# Patient Record
Sex: Female | Born: 1990 | Race: White | Hispanic: No | Marital: Single | Smoking: Never smoker
Health system: Southern US, Community
[De-identification: ages and names within clinical notes are randomized; demographics above are authoritative.]

## PROBLEM LIST (undated history)

## (undated) DIAGNOSIS — F429 Obsessive-compulsive disorder, unspecified: Secondary | ICD-10-CM

## (undated) HISTORY — PX: WISDOM TOOTH EXTRACTION: SHX21

## (undated) HISTORY — PX: APPENDECTOMY: SHX54

## (undated) HISTORY — PX: TONSILLECTOMY: SUR1361

---
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
## 2015-11-28 DIAGNOSIS — D225 Melanocytic nevi of trunk: Secondary | ICD-10-CM | POA: Diagnosis not present

## 2015-11-28 DIAGNOSIS — D2262 Melanocytic nevi of left upper limb, including shoulder: Secondary | ICD-10-CM | POA: Diagnosis not present

## 2015-11-28 DIAGNOSIS — D224 Melanocytic nevi of scalp and neck: Secondary | ICD-10-CM | POA: Diagnosis not present

## 2016-02-06 DIAGNOSIS — L720 Epidermal cyst: Secondary | ICD-10-CM | POA: Diagnosis not present

## 2016-02-06 DIAGNOSIS — D225 Melanocytic nevi of trunk: Secondary | ICD-10-CM | POA: Diagnosis not present

## 2016-02-12 DIAGNOSIS — F429 Obsessive-compulsive disorder, unspecified: Secondary | ICD-10-CM | POA: Diagnosis not present

## 2016-02-12 DIAGNOSIS — Z23 Encounter for immunization: Secondary | ICD-10-CM | POA: Diagnosis not present

## 2016-04-01 DIAGNOSIS — S61305A Unspecified open wound of left ring finger with damage to nail, initial encounter: Secondary | ICD-10-CM | POA: Diagnosis not present

## 2016-04-02 DIAGNOSIS — S6992XA Unspecified injury of left wrist, hand and finger(s), initial encounter: Secondary | ICD-10-CM | POA: Diagnosis not present

## 2016-04-09 DIAGNOSIS — S6992XD Unspecified injury of left wrist, hand and finger(s), subsequent encounter: Secondary | ICD-10-CM | POA: Diagnosis not present

## 2016-04-11 DIAGNOSIS — S61305D Unspecified open wound of left ring finger with damage to nail, subsequent encounter: Secondary | ICD-10-CM | POA: Diagnosis not present

## 2016-04-29 DIAGNOSIS — H5213 Myopia, bilateral: Secondary | ICD-10-CM | POA: Diagnosis not present

## 2016-05-18 DIAGNOSIS — S6992XD Unspecified injury of left wrist, hand and finger(s), subsequent encounter: Secondary | ICD-10-CM | POA: Diagnosis not present

## 2016-05-18 DIAGNOSIS — H6122 Impacted cerumen, left ear: Secondary | ICD-10-CM | POA: Diagnosis not present

## 2016-05-18 DIAGNOSIS — J069 Acute upper respiratory infection, unspecified: Secondary | ICD-10-CM | POA: Diagnosis not present

## 2016-07-02 DIAGNOSIS — J069 Acute upper respiratory infection, unspecified: Secondary | ICD-10-CM | POA: Diagnosis not present

## 2016-07-07 DIAGNOSIS — Z3202 Encounter for pregnancy test, result negative: Secondary | ICD-10-CM | POA: Diagnosis not present

## 2016-07-07 DIAGNOSIS — Z01419 Encounter for gynecological examination (general) (routine) without abnormal findings: Secondary | ICD-10-CM | POA: Diagnosis not present

## 2016-07-07 DIAGNOSIS — Z6826 Body mass index (BMI) 26.0-26.9, adult: Secondary | ICD-10-CM | POA: Diagnosis not present

## 2016-07-07 DIAGNOSIS — Z113 Encounter for screening for infections with a predominantly sexual mode of transmission: Secondary | ICD-10-CM | POA: Diagnosis not present

## 2016-11-17 DIAGNOSIS — D225 Melanocytic nevi of trunk: Secondary | ICD-10-CM | POA: Diagnosis not present

## 2016-11-17 DIAGNOSIS — D2262 Melanocytic nevi of left upper limb, including shoulder: Secondary | ICD-10-CM | POA: Diagnosis not present

## 2016-11-17 DIAGNOSIS — D224 Melanocytic nevi of scalp and neck: Secondary | ICD-10-CM | POA: Diagnosis not present

## 2016-11-17 DIAGNOSIS — L814 Other melanin hyperpigmentation: Secondary | ICD-10-CM | POA: Diagnosis not present

## 2017-03-25 DIAGNOSIS — J069 Acute upper respiratory infection, unspecified: Secondary | ICD-10-CM | POA: Diagnosis not present

## 2017-04-09 DIAGNOSIS — Z23 Encounter for immunization: Secondary | ICD-10-CM | POA: Diagnosis not present

## 2017-05-11 DIAGNOSIS — H5213 Myopia, bilateral: Secondary | ICD-10-CM | POA: Diagnosis not present

## 2017-07-12 DIAGNOSIS — H43811 Vitreous degeneration, right eye: Secondary | ICD-10-CM | POA: Diagnosis not present

## 2017-07-15 DIAGNOSIS — J069 Acute upper respiratory infection, unspecified: Secondary | ICD-10-CM | POA: Diagnosis not present

## 2017-07-29 DIAGNOSIS — Z113 Encounter for screening for infections with a predominantly sexual mode of transmission: Secondary | ICD-10-CM | POA: Diagnosis not present

## 2017-07-29 DIAGNOSIS — Z6827 Body mass index (BMI) 27.0-27.9, adult: Secondary | ICD-10-CM | POA: Diagnosis not present

## 2017-07-29 DIAGNOSIS — Z3202 Encounter for pregnancy test, result negative: Secondary | ICD-10-CM | POA: Diagnosis not present

## 2017-07-29 DIAGNOSIS — Z01419 Encounter for gynecological examination (general) (routine) without abnormal findings: Secondary | ICD-10-CM | POA: Diagnosis not present

## 2017-08-09 DIAGNOSIS — H43811 Vitreous degeneration, right eye: Secondary | ICD-10-CM | POA: Diagnosis not present

## 2017-08-31 DIAGNOSIS — R1031 Right lower quadrant pain: Secondary | ICD-10-CM | POA: Diagnosis not present

## 2017-09-01 ENCOUNTER — Ambulatory Visit
Admission: RE | Admit: 2017-09-01 | Discharge: 2017-09-01 | Disposition: A | Discharge status: Home / Self Care | Payer: Self-pay | Source: Ambulatory Visit | Attending: Family Medicine | Admitting: Family Medicine

## 2017-09-01 ENCOUNTER — Other Ambulatory Visit: Payer: Self-pay

## 2017-09-01 ENCOUNTER — Other Ambulatory Visit: Payer: Self-pay | Admitting: Family Medicine

## 2017-09-01 ENCOUNTER — Encounter (HOSPITAL_COMMUNITY): Payer: Self-pay

## 2017-09-01 ENCOUNTER — Observation Stay (HOSPITAL_COMMUNITY)
Admission: EM | Admit: 2017-09-01 | Discharge: 2017-09-03 | DRG: 343 | Disposition: A | Discharge status: Home / Self Care | Payer: BLUE CROSS/BLUE SHIELD | Attending: Surgery | Admitting: Surgery

## 2017-09-01 DIAGNOSIS — Z793 Long term (current) use of hormonal contraceptives: Secondary | ICD-10-CM | POA: Diagnosis not present

## 2017-09-01 DIAGNOSIS — Z3202 Encounter for pregnancy test, result negative: Secondary | ICD-10-CM | POA: Diagnosis not present

## 2017-09-01 DIAGNOSIS — Z79891 Long term (current) use of opiate analgesic: Secondary | ICD-10-CM | POA: Diagnosis not present

## 2017-09-01 DIAGNOSIS — F429 Obsessive-compulsive disorder, unspecified: Secondary | ICD-10-CM | POA: Diagnosis not present

## 2017-09-01 DIAGNOSIS — R1031 Right lower quadrant pain: Secondary | ICD-10-CM

## 2017-09-01 DIAGNOSIS — K3580 Unspecified acute appendicitis: Principal | ICD-10-CM | POA: Diagnosis present

## 2017-09-01 DIAGNOSIS — R350 Frequency of micturition: Secondary | ICD-10-CM | POA: Diagnosis not present

## 2017-09-01 DIAGNOSIS — Z79899 Other long term (current) drug therapy: Secondary | ICD-10-CM | POA: Diagnosis not present

## 2017-09-01 HISTORY — DX: Obsessive-compulsive disorder, unspecified: F42.9

## 2017-09-01 LAB — COMPREHENSIVE METABOLIC PANEL
ALK PHOS: 73 U/L (ref 38–126)
ALT: 20 U/L (ref 14–54)
AST: 17 U/L (ref 15–41)
Albumin: 4 g/dL (ref 3.5–5.0)
Anion gap: 9 (ref 5–15)
BILIRUBIN TOTAL: 0.8 mg/dL (ref 0.3–1.2)
BUN: 11 mg/dL (ref 6–20)
CALCIUM: 9.2 mg/dL (ref 8.9–10.3)
CO2: 27 mmol/L (ref 22–32)
CREATININE: 0.61 mg/dL (ref 0.44–1.00)
Chloride: 104 mmol/L (ref 101–111)
GFR calc Af Amer: >60 mL/min (ref >60)
Glucose, Bld: 130 mg/dL — ABNORMAL HIGH (ref 65–99)
POTASSIUM: 4 mmol/L (ref 3.5–5.1)
Sodium: 140 mmol/L (ref 135–145)
TOTAL PROTEIN: 7.6 g/dL (ref 6.5–8.1)

## 2017-09-01 LAB — URINALYSIS, ROUTINE W REFLEX MICROSCOPIC
BILIRUBIN URINE: NEGATIVE
Bacteria, UA: NONE SEEN
GLUCOSE, UA: NEGATIVE mg/dL
Ketones, ur: NEGATIVE mg/dL
Leukocytes, UA: NEGATIVE
NITRITE: NEGATIVE
PH: 7 (ref 5.0–8.0)
Protein, ur: NEGATIVE mg/dL
Specific Gravity, Urine: 1.013 (ref 1.005–1.030)
Squamous Epithelial / LPF: NONE SEEN
WBC, UA: NONE SEEN WBC/hpf (ref 0–5)

## 2017-09-01 LAB — CBC
HEMATOCRIT: 40.6 % (ref 36.0–46.0)
Hemoglobin: 13.4 g/dL (ref 12.0–15.0)
MCH: 29.3 pg (ref 26.0–34.0)
MCHC: 33 g/dL (ref 30.0–36.0)
MCV: 88.8 fL (ref 78.0–100.0)
PLATELETS: 361 10*3/uL (ref 150–400)
RBC: 4.57 MIL/uL (ref 3.87–5.11)
RDW: 12.8 % (ref 11.5–15.5)
WBC: 8.5 10*3/uL (ref 4.0–10.5)

## 2017-09-01 LAB — I-STAT BETA HCG BLOOD, ED (MC, WL, AP ONLY): I-stat hCG, quantitative: <5 m[IU]/mL (ref <5)

## 2017-09-01 LAB — LIPASE, BLOOD: Lipase: 34 U/L (ref 11–51)

## 2017-09-01 MED ORDER — CHLORHEXIDINE GLUCONATE CLOTH 2 % EX PADS
6.0000 | MEDICATED_PAD | Freq: Once | CUTANEOUS | Status: AC
  Administered 2017-09-01: 6 via TOPICAL

## 2017-09-01 MED ORDER — SIMETHICONE 80 MG PO CHEW: 40.0000 mg | CHEWABLE_TABLET | Freq: Four times a day (QID) | ORAL | Status: DC | PRN

## 2017-09-01 MED ORDER — MAGIC MOUTHWASH
15.0000 mL | Freq: Four times a day (QID) | ORAL | Status: DC | PRN
  Filled 2017-09-01: qty 15

## 2017-09-01 MED ORDER — METHOCARBAMOL 1000 MG/10ML IJ SOLN
1000.0000 mg | Freq: Four times a day (QID) | INTRAMUSCULAR | Status: DC | PRN
  Filled 2017-09-01: qty 10

## 2017-09-01 MED ORDER — CEFTRIAXONE SODIUM 2 G IJ SOLR
2.0000 g | INTRAMUSCULAR | Status: DC
  Administered 2017-09-01 – 2017-09-02 (×2): 2 g via INTRAVENOUS
  Filled 2017-09-01 (×2): qty 2

## 2017-09-01 MED ORDER — MENTHOL 3 MG MT LOZG: 1.0000 | LOZENGE | OROMUCOSAL | Status: DC | PRN

## 2017-09-01 MED ORDER — LIP MEDEX EX OINT
1.0000 "application " | TOPICAL_OINTMENT | Freq: Two times a day (BID) | CUTANEOUS | Status: DC
  Administered 2017-09-01 – 2017-09-03 (×3): 1 via TOPICAL
  Filled 2017-09-01 (×2): qty 7

## 2017-09-01 MED ORDER — METRONIDAZOLE IN NACL 5-0.79 MG/ML-% IV SOLN
500.0000 mg | Freq: Three times a day (TID) | INTRAVENOUS | Status: DC
  Administered 2017-09-01 – 2017-09-03 (×5): 500 mg via INTRAVENOUS
  Filled 2017-09-01 (×5): qty 100

## 2017-09-01 MED ORDER — OXYCODONE HCL 5 MG PO TABS
5.0000 mg | ORAL_TABLET | ORAL | Status: DC | PRN
  Administered 2017-09-02 – 2017-09-03 (×3): 10 mg via ORAL
  Filled 2017-09-01 (×3): qty 2

## 2017-09-01 MED ORDER — LEVONORGESTREL-ETHINYL ESTRAD 0.15-30 MG-MCG PO TABS
1.0000 | ORAL_TABLET | Freq: Every day | ORAL | Status: DC
  Administered 2017-09-01 – 2017-09-03 (×2): 1 via ORAL

## 2017-09-01 MED ORDER — PROCHLORPERAZINE EDISYLATE 5 MG/ML IJ SOLN: 5.0000 mg | INTRAMUSCULAR | Status: DC | PRN

## 2017-09-01 MED ORDER — ONDANSETRON HCL 4 MG/2ML IJ SOLN
4.0000 mg | Freq: Four times a day (QID) | INTRAMUSCULAR | Status: DC | PRN
  Administered 2017-09-02: 4 mg via INTRAVENOUS

## 2017-09-01 MED ORDER — DIPHENHYDRAMINE HCL 50 MG/ML IJ SOLN: 12.5000 mg | Freq: Four times a day (QID) | INTRAMUSCULAR | Status: DC | PRN

## 2017-09-01 MED ORDER — SODIUM CHLORIDE 0.9 % IV BOLUS
500.0000 mL | Freq: Once | INTRAVENOUS | Status: AC
  Administered 2017-09-01: 500 mL via INTRAVENOUS

## 2017-09-01 MED ORDER — ONDANSETRON HCL 40 MG/20ML IJ SOLN
8.0000 mg | Freq: Four times a day (QID) | INTRAMUSCULAR | Status: DC | PRN
  Filled 2017-09-01: qty 4

## 2017-09-01 MED ORDER — FLUOXETINE HCL 20 MG PO CAPS
60.0000 mg | ORAL_CAPSULE | Freq: Every day | ORAL | Status: DC
  Administered 2017-09-01 – 2017-09-03 (×2): 60 mg via ORAL
  Filled 2017-09-01 (×3): qty 3

## 2017-09-01 MED ORDER — GUAIFENESIN-DM 100-10 MG/5ML PO SYRP: 10.0000 mL | ORAL_SOLUTION | ORAL | Status: DC | PRN

## 2017-09-01 MED ORDER — LACTATED RINGERS IV BOLUS
1000.0000 mL | Freq: Once | INTRAVENOUS | Status: AC
  Administered 2017-09-01: 1000 mL via INTRAVENOUS

## 2017-09-01 MED ORDER — METRONIDAZOLE IN NACL 5-0.79 MG/ML-% IV SOLN: 500.0000 mg | INTRAVENOUS | Status: AC

## 2017-09-01 MED ORDER — LORAZEPAM 2 MG/ML IJ SOLN: 0.5000 mg | Freq: Three times a day (TID) | INTRAMUSCULAR | Status: DC | PRN

## 2017-09-01 MED ORDER — HYDROCORTISONE 1 % EX CREA
1.0000 "application " | TOPICAL_CREAM | Freq: Three times a day (TID) | CUTANEOUS | Status: DC | PRN
  Filled 2017-09-01: qty 28

## 2017-09-01 MED ORDER — LACTATED RINGERS IV BOLUS: 1000.0000 mL | Freq: Three times a day (TID) | INTRAVENOUS | Status: DC | PRN

## 2017-09-01 MED ORDER — ACETAMINOPHEN 500 MG PO TABS: 1000.0000 mg | ORAL_TABLET | ORAL | Status: AC

## 2017-09-01 MED ORDER — HYDROMORPHONE HCL 1 MG/ML IJ SOLN
0.5000 mg | INTRAMUSCULAR | Status: DC | PRN
  Administered 2017-09-02: 1 mg via INTRAVENOUS
  Filled 2017-09-01: qty 1

## 2017-09-01 MED ORDER — METOCLOPRAMIDE HCL 5 MG/ML IJ SOLN: 10.0000 mg | Freq: Four times a day (QID) | INTRAMUSCULAR | Status: DC | PRN

## 2017-09-01 MED ORDER — ONDANSETRON 4 MG PO TBDP: 4.0000 mg | ORAL_TABLET | Freq: Four times a day (QID) | ORAL | Status: DC | PRN

## 2017-09-01 MED ORDER — CELECOXIB 200 MG PO CAPS: 200.0000 mg | ORAL_CAPSULE | ORAL | Status: AC

## 2017-09-01 MED ORDER — ONDANSETRON HCL 4 MG/2ML IJ SOLN: 4.0000 mg | Freq: Four times a day (QID) | INTRAMUSCULAR | Status: DC | PRN

## 2017-09-01 MED ORDER — HYDROCORTISONE 2.5 % RE CREA
1.0000 "application " | TOPICAL_CREAM | Freq: Four times a day (QID) | RECTAL | Status: DC | PRN
  Filled 2017-09-01: qty 28.35

## 2017-09-01 MED ORDER — ALUM & MAG HYDROXIDE-SIMETH 200-200-20 MG/5ML PO SUSP: 30.0000 mL | Freq: Four times a day (QID) | ORAL | Status: DC | PRN

## 2017-09-01 MED ORDER — ACETAMINOPHEN 650 MG RE SUPP: 650.0000 mg | Freq: Four times a day (QID) | RECTAL | Status: DC | PRN

## 2017-09-01 MED ORDER — SODIUM CHLORIDE 0.9 % IV SOLN
2.0000 g | INTRAVENOUS | Status: AC
  Administered 2017-09-02: 2 g via INTRAVENOUS
  Filled 2017-09-01: qty 20

## 2017-09-01 MED ORDER — GABAPENTIN 300 MG PO CAPS: 300.0000 mg | ORAL_CAPSULE | ORAL | Status: AC

## 2017-09-01 MED ORDER — ENOXAPARIN SODIUM 40 MG/0.4ML ~~LOC~~ SOLN
40.0000 mg | SUBCUTANEOUS | Status: DC
  Administered 2017-09-01: 40 mg via SUBCUTANEOUS
  Filled 2017-09-01: qty 0.4

## 2017-09-01 MED ORDER — IOPAMIDOL (ISOVUE-300) INJECTION 61%
100.0000 mL | Freq: Once | INTRAVENOUS | Status: AC | PRN
  Administered 2017-09-01: 100 mL via INTRAVENOUS

## 2017-09-01 MED ORDER — DIPHENHYDRAMINE HCL 25 MG PO CAPS: 25.0000 mg | ORAL_CAPSULE | Freq: Four times a day (QID) | ORAL | Status: DC | PRN

## 2017-09-01 MED ORDER — ACETAMINOPHEN 325 MG PO TABS
650.0000 mg | ORAL_TABLET | Freq: Four times a day (QID) | ORAL | Status: DC | PRN
  Administered 2017-09-02: 650 mg via ORAL
  Filled 2017-09-01: qty 2

## 2017-09-01 MED ORDER — PHENOL 1.4 % MT LIQD
1.0000 | OROMUCOSAL | Status: DC | PRN
  Filled 2017-09-01: qty 177

## 2017-09-01 MED ORDER — LACTATED RINGERS IV SOLN
INTRAVENOUS | Status: DC
  Administered 2017-09-02 – 2017-09-03 (×2): via INTRAVENOUS

## 2017-09-01 NOTE — H&P (Signed)
[facility]  [address] [place], [place] [address] Phone: [phone] FAX: [phone]     Marie Schwartz  1990/11/05 [id number]  CARE TEAM:  PCP: [facility] Team: Patient Care Team: [place] [place] as PCP - General (Family Medicine)  Inpatient Treatment Team: Treatment Team: Attending Provider: Duffy Bruce, MD; Technician: Estill Bamberg, NT; Registered Nurse: Michel Harrow, RN; Physician Assistant: Dossie Der; Consulting Physician: Edison Pace, Md, MD   This patient is a 28 y.o.female who presents today for surgical evaluation at the request of Avie Echevaria, PA-C.   Chief complaint / Reason for evaluation: Appendicitis  Paged at 5:10pm for consult for acute appendicitis.  27 year old female otherwise healthy who noted abdominal pain for the past 3 days.  In her right lower side.  She thought it may be related to menstrual cramping.  Moderate tenderness.  Decreased appetite but no severe nausea or vomiting.  No change in diet.  No sick contacts.  Normally moves her bowels twice a day.  Slow down to once a day but no rectal bleeding.  No emesis.  No fevers chills or sweats.  Had some partial relief with over-the-counter pain medications but persisted.  Was concerned and went to an urgent care clinic yesterday.  No severely concerning signs.  Recommended follow-up with primary care office if not better.  Saw primary care office today.  CT scan ordered.  Done this afternoon.  Came back positive for appendicitis.  Patient was told to come to the emergency room.    Patient is here with her mother and boyfriend.  Her mother has a history of ulcerative colitis but otherwise no family history of any other bowel issues.  No personal nor family history of GI/colon cancer irritable bowel syndrome, allergy such as Celiac Sprue, dietary/dairy problems,  colitis, ulcers nor gastritis.  No recent sick contacts/gastroenteritis.  No travel outside the country.  No changes in diet.  No dysphagia to solids or liquids.  No significant heartburn or reflux.  No hematochezia, hematemesis, coffee ground emesis.  No evidence of prior gastric/peptic ulceration.    Assessment  Marie Schwartz  27 y.o. female       Problem List:  Principal Problem:   Acute appendicitis   History physical and CT scan suspicious for appendicitis.  Plan:  Admit.  IV fluids.  IV antibiotics.  Diagnostic laparoscopy with appendectomy.  I did note that there is been some interest in nonoperative management and she is young with early appendicitis, but failure rate seems to be unacceptably high.  I noted we can do antibiotics and she could think about things.  However she and her mother much more inclined to go to proceed with surgery.  We will tentatively do this in the morning for better OR availability and give her time to be better tuned up and resuscitated.  Dr. Ninfa Linden will assume care in the morning  The anatomy & physiology of the digestive tract was discussed.  The pathophysiology of appendicitis and other appendiceal disorders were discussed.  Natural history risks without surgery was discussed.   I feel the risks of no intervention will lead to serious problems that outweigh the operative risks; therefore, I recommended diagnostic laparoscopy with removal of appendix to remove the pathology.  Laparoscopic & open techniques were discussed.   I noted a good likelihood this will help address the problem.  Risks such as bleeding, infection, abscess, leak, reoperation, injury to other organs, need for repair of tissues / organs, possible ostomy, hernia, heart attack, stroke, death, and other risks were discussed.  Goals of post-operative recovery were discussed as well.  We will work to minimize complications.  Questions were answered.  The patient expresses  understanding & wishes to proceed with surgery.  -OCD -controlled with Prozac.  No concerning behavior.  Continue. -VTE prophylaxis- SCDs, etc -mobilize as tolerated to help recovery  30 minutes spent in review, evaluation, examination, counseling, and coordination of care.  More than 50% of that time was spent in counseling.  Adin Hector, M.D., F.A.C.S. Gastrointestinal and Minimally Invasive Surgery [facility] [address] [place], [place] [address] [phone] Main / Paging   09/01/2017      History reviewed. No pertinent past medical history.  Past Surgical History:  Procedure Laterality Date  . TONSILLECTOMY    . WISDOM TOOTH EXTRACTION      Social History   Socioeconomic History  . Marital status: Single    Spouse name: Not on file  . Number of children: Not on file  . Years of education: Not on file  . Highest education level: Not on file  Occupational History  . Not on file  Social Needs  . Financial resource strain: Not on file  . Food insecurity:    Worry: Not on file    Inability: Not on file  . Transportation needs:    Medical: Not on file    Non-medical: Not on file  Tobacco Use  . Smoking status: Never Smoker  . Smokeless tobacco: Never Used  Substance and Sexual Activity  . Alcohol use: Never    Frequency: Never  . Drug use: Never  . Sexual activity: Not on file  Lifestyle  . Physical activity:    Days per week: Not on file    Minutes per session: Not on file  . Stress: Not on file  Relationships  . Social connections:    Talks on phone: Not on file    Gets together: Not on file    Attends religious service: Not on file    Active member of club or organization: Not on file    Attends meetings of clubs or organizations: Not on file    Relationship status: Not on file  . Intimate partner violence:    Fear of current or ex partner: Not on file    Emotionally abused: Not on file    Physically  abused: Not on file    Forced sexual activity: Not on file  Other Topics Concern  . Not on file  Social History Narrative  . Not on file    History reviewed. No pertinent family history.  No current facility-administered medications for this encounter.    Current Outpatient Medications  Medication Sig Dispense Refill  . acetaminophen (TYLENOL) 500 MG tablet Take 1,000 mg by mouth daily as needed.    Marland Kitchen FLUoxetine (PROZAC) 20 MG tablet Take 60 mg by mouth daily.    Marland Kitchen LILLOW 0.15-30 MG-MCG tablet Take 1 tablet by mouth daily.  1     No Known Allergies  ROS:   All other systems reviewed & are negative except per HPI or as noted below: Constitutional:  No fevers, chills, sweats.  Weight stable Eyes:  No vision changes, No discharge HENT:  No sore throats, nasal drainage Lymph: No neck swelling, No bruising easily Pulmonary:  No cough, productive sputum CV: No orthopnea, PND  Patient walks 60 minutes for about 2 miles without difficulty.  No exertional chest/neck/shoulder/arm pain. GI: No personal nor family history of GI/colon cancer, irritable bowel syndrome, allergy such as Celiac Sprue, dietary/dairy problems, colitis, ulcers nor gastritis.  No recent sick contacts/gastroenteritis.  No travel outside the country.  No changes in diet. Renal: No UTIs, No hematuria Genital:  No drainage, bleeding, masses Musculoskeletal: No severe joint pain.  Good ROM major joints Skin:  No sores or lesions.  No rashes Heme/Lymph:  No easy bleeding.  No swollen lymph nodes Neuro: No focal weakness/numbness.  No seizures Psych: No suicidal ideation.  No hallucinations  BP 138/79 (BP Location: Left Arm)   Pulse 85   Temp 98.5 F (36.9 C) (Oral)   Resp 16   LMP 08/30/2017   SpO2 97%   Physical Exam: General: Pt awake/alert/oriented x4 in no major acute distress Eyes: PERRL, normal EOM. Sclera nonicteric Neuro: CN II-XII intact w/o focal sensory/motor deficits. Lymph: No head/neck/groin  lymphadenopathy Psych:  No delerium/psychosis/paranoia HENT: Normocephalic, Mucus membranes moist.  No thrush Neck: Supple, No tracheal deviation Chest: No pain.  Good respiratory excursion. CV:  Pulses intact.  Regular rhythm Abdomen: Soft, Nondistended.  Discomfort with mild guarding in right lower quadrant over McBurney's point.  Mild right flank discomfort.  The rest the abdomen is nontender.  No Murphy sign.  No diastases.  No umbilical hernia.  No incarcerated hernias. Gen:  No inguinal hernias.  No inguinal lymphadenopathy.   Ext:  SCDs BLE.  No significant edema.  No cyanosis Skin: No petechiae / purpurea.  No major sores Musculoskeletal: No severe joint pain.  Good ROM major joints   Results:   Labs: Results for orders placed or performed during the hospital encounter of 09/01/17 (from the past 48 hour(s))  Lipase, blood     Status: None   Collection Time: 09/01/17  3:27 PM  Result Value Ref Range   Lipase 34 11 - 51 U/L    Comment: Performed at [facility], [place] [address] [place], [place] [address]  Comprehensive metabolic panel     Status: Abnormal   Collection Time: 09/01/17  3:27 PM  Result Value Ref Range   Sodium 140 135 - 145 mmol/L   Potassium 4.0 3.5 - 5.1 mmol/L   Chloride 104 101 - 111 mmol/L   CO2 27 22 - 32 mmol/L   Glucose, Bld 130 (H) 65 - 99 mg/dL   BUN 11 6 - 20 mg/dL   Creatinine, Ser 0.61 0.44 - 1.00 mg/dL   Calcium 9.2 8.9 - 10.3 mg/dL   Total Protein 7.6 6.5 - 8.1 g/dL   Albumin 4.0 3.5 - 5.0 g/dL   AST 17 15 - 41 U/L   ALT 20 14 - 54 U/L   Alkaline Phosphatase 73 38 - 126 U/L   Total Bilirubin 0.8 0.3 - 1.2 mg/dL   GFR calc non Af Amer >60 >60 mL/min   GFR calc Af Amer >60 >60 mL/min    Comment: (NOTE) The eGFR has been calculated using the CKD EPI equation. This calculation has not been validated in all clinical situations. eGFR's persistently <60 mL/min signify possible Chronic Kidney Disease.    Anion gap 9 5 - 15     Comment: Performed at [facility], [place] [address], [place], [place] [address]  CBC     Status: None   Collection Time: 09/01/17  3:27  PM  Result Value Ref Range   WBC 8.5 4.0 - 10.5 K/uL   RBC 4.57 3.87 - 5.11 MIL/uL   Hemoglobin 13.4 12.0 - 15.0 g/dL   HCT 40.6 36.0 - 46.0 %   MCV 88.8 78.0 - 100.0 fL   MCH 29.3 26.0 - 34.0 pg   MCHC 33.0 30.0 - 36.0 g/dL   RDW 12.8 11.5 - 15.5 %   Platelets 361 150 - 400 K/uL    Comment: Performed at [facility], [place] [address] [place], [place] [address]  Urinalysis, Routine w reflex microscopic     Status: Abnormal   Collection Time: 09/01/17  3:27 PM  Result Value Ref Range   Color, Urine STRAW (A) YELLOW   APPearance CLEAR CLEAR   Specific Gravity, Urine 1.013 1.005 - 1.030   pH 7.0 5.0 - 8.0   Glucose, UA NEGATIVE NEGATIVE mg/dL   Hgb urine dipstick SMALL (A) NEGATIVE   Bilirubin Urine NEGATIVE NEGATIVE   Ketones, ur NEGATIVE NEGATIVE mg/dL   Protein, ur NEGATIVE NEGATIVE mg/dL   Nitrite NEGATIVE NEGATIVE   Leukocytes, UA NEGATIVE NEGATIVE   RBC / HPF 0-5 0 - 5 RBC/hpf   WBC, UA NONE SEEN 0 - 5 WBC/hpf   Bacteria, UA NONE SEEN NONE SEEN   Squamous Epithelial / LPF NONE SEEN NONE SEEN    Comment: Performed at [facility], [place] [address] [place], [place] [address]  I-Stat beta hCG blood, ED     Status: None   Collection Time: 09/01/17  3:40 PM  Result Value Ref Range   I-stat hCG, quantitative <5.0 <5 mIU/mL   Comment 3            Comment:   GEST. AGE      CONC.  (mIU/mL)   <=1 WEEK        5 - 50     2 WEEKS       50 - 500     3 WEEKS       100 - 10,000     4 WEEKS     1,000 - 30,000        FEMALE AND NON-PREGNANT FEMALE:     LESS THAN 5 mIU/mL     Imaging / Studies: Ct Abdomen Pelvis W Contrast  Result Date: 09/01/2017 CLINICAL DATA:  RIGHT lower quadrant pain for 3 days. Assess for appendicitis versus ovarian torsion. EXAM: CT ABDOMEN AND PELVIS WITH CONTRAST  TECHNIQUE: Multidetector CT imaging of the abdomen and pelvis was performed using the standard protocol following bolus administration of intravenous contrast. CONTRAST:  173m ISOVUE-300 IOPAMIDOL (ISOVUE-300) INJECTION 61% COMPARISON:  CT abdomen and pelvis February 12, 2010 FINDINGS: LOWER CHEST: Lung bases are clear. Included heart size is normal. No pericardial effusion. HEPATOBILIARY: Liver and gallbladder are normal. PANCREAS: Normal. SPLEEN: Normal. ADRENALS/URINARY TRACT: Kidneys are orthotopic, demonstrating symmetric enhancement. No nephrolithiasis, hydronephrosis or solid renal masses. The unopacified ureters are normal in course and caliber. Urinary bladder is partially distended and unremarkable. Normal adrenal glands. STOMACH/BOWEL: The stomach, small and large bowel are normal in course and caliber without inflammatory changes. Appendix: Location: Retrocecal Diameter: 7 mm, previously 3 mm Appendicolith: None Mucosal hyper-enhancement: Slight mural enhancement and periappendiceal inflammatory changes Extraluminal gas: None Periappendiceal collection: None VASCULAR/LYMPHATIC: Aortoiliac vessels are normal in course and caliber. No lymphadenopathy by CT size criteria. REPRODUCTIVE: Normal. OTHER: No intraperitoneal free fluid or free air. MUSCULOSKELETAL: Nonacute. Very small fat containing umbilical hernia. IMPRESSION: Early  acute appendicitis. These results will be called to the ordering clinician or representative by the Radiologist Assistant, and communication documented in the PACS or zVision Dashboard. Electronically Signed   By: Elon Alas M.D.   On: 09/01/2017 14:17    Medications / Allergies: per chart  Antibiotics: Anti-infectives (From admission, onward)   None        Note: Portions of this report may have been transcribed using voice recognition software. Every effort was made to ensure accuracy; however, inadvertent computerized transcription errors may be present.    Any transcriptional errors that result from this process are unintentional.    Adin Hector, M.D., F.A.C.S. Gastrointestinal and Minimally Invasive Surgery [facility] [address], [place], [place] [address] [phone] Main / Paging   09/01/2017

## 2017-09-01 NOTE — ED Provider Notes (Signed)
[facility]-EMERGENCY DEPT Provider Note   CSN: [id number] Arrival date & time: 09/01/17  1455     History   Chief Complaint Chief Complaint  Patient presents with  . Appendicitis    HPI Marie Schwartz is a 27 y.o. female with no past medical history presenting from PCP with confirmed early appendicitis on CT. she reports 5 out of 10 right lower quadrant pain without nausea vomiting diarrhea.  Her pain is worse with movement and better when she lays supine.  She has taken Aleve yesterday with modest relief and Tylenol today. Her pain started approximately 3 days ago in the RLQ and she initially thought it was from her period, but the pain persisted and she went to [facility] yesterday who recommended follow up with PCP today if no improvement. She then went to her PCP today who ordered CT and she was sent here for confirmed early APPY. Denies urinary symptoms. No fever or chills. She declines anything for pain at this time.  No prior surgical history. LMP 08/25/17  HPI  Past Medical History:  Diagnosis Date  . OCD (obsessive compulsive disorder) 09/01/2017    Patient Active Problem List   Diagnosis Date Noted  . Acute appendicitis 09/01/2017  . OCD (obsessive compulsive disorder) 09/01/2017     OB History   None      Home Medications    Prior to Admission medications   Medication Sig Start Date End Date Taking? Authorizing Provider  acetaminophen (TYLENOL) 500 MG tablet Take 1,000 mg by mouth daily as needed.   Yes [provider]  FLUoxetine (PROZAC) 20 MG tablet Take 60 mg by mouth daily.   Yes [provider]  LILLOW 0.15-30 MG-MCG tablet Take 1 tablet by mouth daily. 07/08/17  Yes [provider]    Family History History reviewed. No pertinent family history.  Social History Social History   Tobacco Use  . Smoking status: Never Smoker  . Smokeless tobacco: Never Used  Substance Use Topics  . Alcohol use: Never   Frequency: Never  . Drug use: Never     Allergies   Patient has no known allergies.   Review of Systems Review of Systems  Constitutional: Negative for chills, diaphoresis, fatigue and fever.  HENT: Negative for trouble swallowing.   Respiratory: Negative for cough, choking, chest tightness, shortness of breath, wheezing and stridor.   Cardiovascular: Negative for chest pain and palpitations.  Gastrointestinal: Positive for abdominal pain. Negative for abdominal distention, blood in stool, diarrhea, nausea and vomiting.       Reports a normal bowel movement today. 3 days of right lower quadrant pain  Genitourinary: Negative for decreased urine volume, difficulty urinating, dysuria and hematuria.  Musculoskeletal: Negative for arthralgias and back pain.  Skin: Negative for pallor and rash.  Neurological: Negative for dizziness, seizures, syncope and light-headedness.     Physical Exam Updated Vital Signs BP 129/73 (BP Location: Left Arm)   Pulse 75   Temp 98.5 F (36.9 C) (Oral)   Resp 17   LMP 08/30/2017   SpO2 99%   Physical Exam  Constitutional: She is oriented to person, place, and time. She appears well-developed and well-nourished. No distress.  Afebrile, well-appearing, sitting comfortably in bed no acute distress.  HENT:  Head: Normocephalic and atraumatic.  Eyes: Conjunctivae and EOM are normal. Right eye exhibits no discharge. Left eye exhibits no discharge.  Neck: Normal range of motion.  Cardiovascular: Normal rate, regular rhythm and normal heart  sounds.  No murmur heard. Pulmonary/Chest: Effort normal and breath sounds normal. No stridor. No respiratory distress. She has no wheezes. She has no rales.  Abdominal: Soft. She exhibits no distension and no mass. There is tenderness. There is no rebound and no guarding.  Mild right lower quadrant pain on palpation.  Musculoskeletal: Normal range of motion. She exhibits no edema.  Neurological: She is alert and  oriented to person, place, and time.  Skin: Skin is warm and dry. No rash noted. She is not diaphoretic. No erythema. No pallor.  Psychiatric: She has a normal mood and affect.  Nursing note and vitals reviewed.    ED Treatments / Results  Labs (all labs ordered are listed, but only abnormal results are displayed) Labs Reviewed  COMPREHENSIVE METABOLIC PANEL - Abnormal; Notable for the following components:      Result Value   Glucose, Bld 130 (*)    All other components within normal limits  URINALYSIS, ROUTINE W REFLEX MICROSCOPIC - Abnormal; Notable for the following components:   Color, Urine STRAW (*)    Hgb urine dipstick SMALL (*)    All other components within normal limits  LIPASE, BLOOD  CBC  HIV ANTIBODY (ROUTINE TESTING)  I-STAT BETA HCG BLOOD, ED ([facility], [facility], [facility] ONLY)    EKG None  Radiology Ct Abdomen Pelvis W Contrast  Result Date: 09/01/2017 CLINICAL DATA:  RIGHT lower quadrant pain for 3 days. Assess for appendicitis versus ovarian torsion. EXAM: CT ABDOMEN AND PELVIS WITH CONTRAST TECHNIQUE: Multidetector CT imaging of the abdomen and pelvis was performed using the standard protocol following bolus administration of intravenous contrast. CONTRAST:  ISOVUE-300 IOPAMIDOL (ISOVUE-300) INJECTION 61% COMPARISON:  CT abdomen and pelvis February 12, 2010 FINDINGS: LOWER CHEST: Lung bases are clear. Included heart size is normal. No pericardial effusion. HEPATOBILIARY: Liver and gallbladder are normal. PANCREAS: Normal. SPLEEN: Normal. ADRENALS/URINARY TRACT: Kidneys are orthotopic, demonstrating symmetric enhancement. No nephrolithiasis, hydronephrosis or solid renal masses. The unopacified ureters are normal in course and caliber. Urinary bladder is partially distended and unremarkable. Normal adrenal glands. STOMACH/BOWEL: The stomach, small and large bowel are normal in course and caliber without inflammatory changes. Appendix: Location: Retrocecal Diameter: 7 mm,  previously 3 mm Appendicolith: None Mucosal hyper-enhancement: Slight mural enhancement and periappendiceal inflammatory changes Extraluminal gas: None Periappendiceal collection: None VASCULAR/LYMPHATIC: Aortoiliac vessels are normal in course and caliber. No lymphadenopathy by CT size criteria. REPRODUCTIVE: Normal. OTHER: No intraperitoneal free fluid or free air. MUSCULOSKELETAL: Nonacute. Very small fat containing umbilical hernia. IMPRESSION: Early acute appendicitis. These results will be called to the ordering clinician or representative by the Radiologist Assistant, and communication documented in the PACS or zVision Dashboard. Electronically Signed   By: Awilda Metro M.D.   On: 09/01/2017 14:17    Procedures Procedures (including critical care time)  Medications Ordered in ED Medications  FLUoxetine (PROZAC) tablet 60 mg (has no administration in time range)  levonorgestrel-ethinyl estradiol (NORDETTE) 0.15-30 MG-MCG per tablet 1 tablet (has no administration in time range)  enoxaparin (LOVENOX) injection 40 mg (has no administration in time range)  lactated ringers infusion (has no administration in time range)  acetaminophen (TYLENOL) tablet 650 mg (has no administration in time range)    Or  acetaminophen (TYLENOL) suppository 650 mg (has no administration in time range)  ondansetron (ZOFRAN-ODT) disintegrating tablet 4 mg (has no administration in time range)    Or  ondansetron (ZOFRAN) injection 4 mg (has no administration in time range)  simethicone (MYLICON) chewable  tablet 40 mg (has no administration in time range)  Chlorhexidine Gluconate Cloth 2 % PADS 6 each (has no administration in time range)    And  Chlorhexidine Gluconate Cloth 2 % PADS 6 each (has no administration in time range)  cefTRIAXone (ROCEPHIN) 2 g in sodium chloride 0.9 % 100 mL IVPB (has no administration in time range)    And  metroNIDAZOLE (FLAGYL) IVPB 500 mg (has no administration in time range)   gabapentin (NEURONTIN) capsule 300 mg (has no administration in time range)  acetaminophen (TYLENOL) tablet 1,000 mg (has no administration in time range)  cefTRIAXone (ROCEPHIN) 2 g in sodium chloride 0.9 % 100 mL IVPB (has no administration in time range)    And  metroNIDAZOLE (FLAGYL) IVPB 500 mg (has no administration in time range)  celecoxib (CELEBREX) capsule 200 mg (has no administration in time range)  lactated ringers bolus 1,000 mL (has no administration in time range)  lactated ringers bolus 1,000 mL (has no administration in time range)  methocarbamol (ROBAXIN) 1,000 mg in dextrose 5 % 50 mL IVPB (has no administration in time range)  HYDROmorphone (DILAUDID) injection 0.5-2 mg (has no administration in time range)  oxyCODONE (Oxy IR/ROXICODONE) immediate release tablet 5-10 mg (has no administration in time range)  ondansetron (ZOFRAN) injection 4 mg (has no administration in time range)    Or  ondansetron (ZOFRAN) 8 mg in sodium chloride 0.9 % 50 mL IVPB (has no administration in time range)  prochlorperazine (COMPAZINE) injection 5-10 mg (has no administration in time range)  metoCLOPramide (REGLAN) injection 10 mg (has no administration in time range)  lip balm (CARMEX) ointment 1 application (has no administration in time range)  magic mouthwash (has no administration in time range)  guaiFENesin-dextromethorphan (ROBITUSSIN DM) 100-10 MG/5ML syrup 10 mL (has no administration in time range)  hydrocortisone (ANUSOL-HC) 2.5 % rectal cream 1 application (has no administration in time range)  alum & mag hydroxide-simeth (MAALOX/MYLANTA) 200-200-20 MG/5ML suspension 30 mL (has no administration in time range)  hydrocortisone cream 1 % 1 application (has no administration in time range)  menthol-cetylpyridinium (CEPACOL) lozenge 3 mg (has no administration in time range)  phenol (CHLORASEPTIC) mouth spray 1-2 spray (has no administration in time range)  diphenhydrAMINE  (BENADRYL) injection 12.5-25 mg (has no administration in time range)  diphenhydrAMINE (BENADRYL) capsule 25 mg (has no administration in time range)  LORazepam (ATIVAN) injection 0.5-1 mg (has no administration in time range)  sodium chloride 0.9 % bolus 500 mL (0 mLs Intravenous Stopped 09/01/17 1753)     Initial Impression / Assessment and Plan / ED Course  I have reviewed the triage vital signs and the nursing notes.  Pertinent labs & imaging results that were available during my care of the patient were reviewed by me and considered in my medical decision making (see chart for details).    Otherwise healthy 27 year old female presenting with 3 days of right lower quadrant discomfort.  [facility] abdomen pelvis confirming early appendicitis. No associated symptoms.  Normal BM this morning.  Patient declined any analgesia.  She was made n.p.o.  Called general surgery Spoke to Dr. Michaell CowingGross and patient will be admitted.  Final Clinical Impressions(s) / ED Diagnoses   Final diagnoses:  Acute appendicitis, unspecified acute appendicitis type    ED Discharge Orders    None       Gregary CromerMitchell, Bama Hanselman B, PA-C 09/01/17 1821    Shaune PollackIsaacs, Cameron, MD  09/02/17 0513  

## 2017-09-01 NOTE — ED Notes (Signed)
ED TO INPATIENT HANDOFF REPORT  Name/Age/Gender Marie Schwartz 27 y.o. female  Code Status    Code Status Orders  (From admission, onward)        Start     Ordered   09/01/17 1743  Full code  Continuous     09/01/17 1747    Code Status History    This patient has a current code status but no historical code status.      Home/SNF/Other Home  Chief Complaint abd pain   Level of Care/Admitting Diagnosis ED Disposition    ED Disposition Condition [facility] [[id number]]  Level of Care: Med-Surg [16]  Diagnosis: Acute appendicitis [950932]  Admitting Physician: CCS, Gillham  Attending Physician: CCS, MD [[id number]]  Estimated length of stay: past midnight tomorrow  Certification:: I certify this patient will need inpatient services for at least 2 midnights  PT Class (Do Not Modify): Inpatient [101]  PT Acc Code (Do Not Modify): Private [1]       Medical History Past Medical History:  Diagnosis Date  . OCD (obsessive compulsive disorder) 09/01/2017    Allergies No Known Allergies  IV Location/Drains/Wounds Patient Lines/Drains/Airways Status   Active Line/Drains/Airways    Name:   Placement date:   Placement time:   Site:   Days:   Peripheral IV 09/01/17 Right Antecubital   09/01/17    1551    Antecubital   less than 1          Labs/Imaging Results for orders placed or performed during the hospital encounter of 09/01/17 (from the past 48 hour(s))  Lipase, blood     Status: None   Collection Time: 09/01/17  3:27 PM  Result Value Ref Range   Lipase 34 11 - 51 U/L    Comment: Performed at [facility], [place] [address] [place], [place] [address]  Comprehensive metabolic panel     Status: Abnormal   Collection Time: 09/01/17  3:27 PM  Result Value Ref Range   Sodium 140 135 - 145 mmol/L   Potassium 4.0 3.5 - 5.1 mmol/L   Chloride 104 101 - 111 mmol/L   CO2 27 22 - 32 mmol/L   Glucose,  Bld 130 (H) 65 - 99 mg/dL   BUN 11 6 - 20 mg/dL   Creatinine, Ser 0.61 0.44 - 1.00 mg/dL   Calcium 9.2 8.9 - 10.3 mg/dL   Total Protein 7.6 6.5 - 8.1 g/dL   Albumin 4.0 3.5 - 5.0 g/dL   AST 17 15 - 41 U/L   ALT 20 14 - 54 U/L   Alkaline Phosphatase 73 38 - 126 U/L   Total Bilirubin 0.8 0.3 - 1.2 mg/dL   GFR calc non Af Amer >60 >60 mL/min   GFR calc Af Amer >60 >60 mL/min    Comment: (NOTE) The eGFR has been calculated using the CKD EPI equation. This calculation has not been validated in all clinical situations. eGFR's persistently <60 mL/min signify possible Chronic Kidney Disease.    Anion gap 9 5 - 15    Comment: Performed at [facility], [place] [address], [place], [place] [address]  CBC     Status: None   Collection Time: 09/01/17  3:27 PM  Result Value Ref Range   WBC 8.5 4.0 - 10.5 K/uL   RBC 4.57 3.87 - 5.11 MIL/uL   Hemoglobin 13.4 12.0 - 15.0 g/dL   HCT 40.6 36.0 -  46.0 %   MCV 88.8 78.0 - 100.0 fL   MCH 29.3 26.0 - 34.0 pg   MCHC 33.0 30.0 - 36.0 g/dL   RDW 12.8 11.5 - 15.5 %   Platelets 361 150 - 400 K/uL    Comment: Performed at [facility], [place] [address], [place], [place] [address]  Urinalysis, Routine w reflex microscopic     Status: Abnormal   Collection Time: 09/01/17  3:27 PM  Result Value Ref Range   Color, Urine STRAW (A) YELLOW   APPearance CLEAR CLEAR   Specific Gravity, Urine 1.013 1.005 - 1.030   pH 7.0 5.0 - 8.0   Glucose, UA NEGATIVE NEGATIVE mg/dL   Hgb urine dipstick SMALL (A) NEGATIVE   Bilirubin Urine NEGATIVE NEGATIVE   Ketones, ur NEGATIVE NEGATIVE mg/dL   Protein, ur NEGATIVE NEGATIVE mg/dL   Nitrite NEGATIVE NEGATIVE   Leukocytes, UA NEGATIVE NEGATIVE   RBC / HPF 0-5 0 - 5 RBC/hpf   WBC, UA NONE SEEN 0 - 5 WBC/hpf   Bacteria, UA NONE SEEN NONE SEEN   Squamous Epithelial / LPF NONE SEEN NONE SEEN    Comment: Performed at [facility], [place] [address], [place], [place] [address]   I-Stat beta hCG blood, ED     Status: None   Collection Time: 09/01/17  3:40 PM  Result Value Ref Range   I-stat hCG, quantitative <5.0 <5 mIU/mL   Comment 3            Comment:   GEST. AGE      CONC.  (mIU/mL)   <=1 WEEK        5 - 50     2 WEEKS       50 - 500     3 WEEKS       100 - 10,000     4 WEEKS     1,000 - 30,000        FEMALE AND NON-PREGNANT FEMALE:     LESS THAN 5 mIU/mL    Ct Abdomen Pelvis W Contrast  Result Date: 09/01/2017 CLINICAL DATA:  RIGHT lower quadrant pain for 3 days. Assess for appendicitis versus ovarian torsion. EXAM: CT ABDOMEN AND PELVIS WITH CONTRAST TECHNIQUE: Multidetector CT imaging of the abdomen and pelvis was performed using the standard protocol following bolus administration of intravenous contrast. CONTRAST:  115m ISOVUE-300 IOPAMIDOL (ISOVUE-300) INJECTION 61% COMPARISON:  CT abdomen and pelvis February 12, 2010 FINDINGS: LOWER CHEST: Lung bases are clear. Included heart size is normal. No pericardial effusion. HEPATOBILIARY: Liver and gallbladder are normal. PANCREAS: Normal. SPLEEN: Normal. ADRENALS/URINARY TRACT: Kidneys are orthotopic, demonstrating symmetric enhancement. No nephrolithiasis, hydronephrosis or solid renal masses. The unopacified ureters are normal in course and caliber. Urinary bladder is partially distended and unremarkable. Normal adrenal glands. STOMACH/BOWEL: The stomach, small and large bowel are normal in course and caliber without inflammatory changes. Appendix: Location: Retrocecal Diameter: 7 mm, previously 3 mm Appendicolith: None Mucosal hyper-enhancement: Slight mural enhancement and periappendiceal inflammatory changes Extraluminal gas: None Periappendiceal collection: None VASCULAR/LYMPHATIC: Aortoiliac vessels are normal in course and caliber. No lymphadenopathy by CT size criteria. REPRODUCTIVE: Normal. OTHER: No intraperitoneal free fluid or free air. MUSCULOSKELETAL: Nonacute. Very small fat containing umbilical hernia.  IMPRESSION: Early acute appendicitis. These results will be called to the ordering clinician or representative by the Radiologist Assistant, and communication documented in the PACS or zVision Dashboard. Electronically Signed   By: CElon AlasM.D.   On: 09/01/2017  14:17    Pending Labs Unresulted Labs (From admission, onward)   Start     Ordered   09/08/17 0500  Creatinine, serum  (enoxaparin (LOVENOX)    CrCl >/= 30 ml/min)  Weekly,   R    Comments:  while on enoxaparin therapy    09/01/17 1747   09/01/17 1743  HIV antibody (Routine Testing)  Once,   R     09/01/17 1747      Vitals/Pain Today's Vitals   09/01/17 1522 09/01/17 1551 09/01/17 1757  BP: 138/79  129/73  Pulse: 85  75  Resp: 16  17  Temp: 98.5 F (36.9 C)    TempSrc: Oral    SpO2: 97%  99%  PainSc: 5  0-No pain     Isolation Precautions No active isolations  Medications Medications  FLUoxetine (PROZAC) tablet 60 mg (has no administration in time range)  levonorgestrel-ethinyl estradiol (NORDETTE) 0.15-30 MG-MCG per tablet 1 tablet (has no administration in time range)  enoxaparin (LOVENOX) injection 40 mg (has no administration in time range)  lactated ringers infusion (has no administration in time range)  acetaminophen (TYLENOL) tablet 650 mg (has no administration in time range)    Or  acetaminophen (TYLENOL) suppository 650 mg (has no administration in time range)  ondansetron (ZOFRAN-ODT) disintegrating tablet 4 mg (has no administration in time range)    Or  ondansetron (ZOFRAN) injection 4 mg (has no administration in time range)  simethicone (MYLICON) chewable tablet 40 mg (has no administration in time range)  Chlorhexidine Gluconate Cloth 2 % PADS 6 each (has no administration in time range)    And  Chlorhexidine Gluconate Cloth 2 % PADS 6 each (has no administration in time range)  cefTRIAXone (ROCEPHIN) 2 g in sodium chloride 0.9 % 100 mL IVPB (has no administration in time range)    And   metroNIDAZOLE (FLAGYL) IVPB 500 mg (has no administration in time range)  gabapentin (NEURONTIN) capsule 300 mg (has no administration in time range)  acetaminophen (TYLENOL) tablet 1,000 mg (has no administration in time range)  cefTRIAXone (ROCEPHIN) 2 g in sodium chloride 0.9 % 100 mL IVPB (has no administration in time range)    And  metroNIDAZOLE (FLAGYL) IVPB 500 mg (has no administration in time range)  celecoxib (CELEBREX) capsule 200 mg (has no administration in time range)  lactated ringers bolus 1,000 mL (has no administration in time range)  lactated ringers bolus 1,000 mL (has no administration in time range)  methocarbamol (ROBAXIN) 1,000 mg in dextrose 5 % 50 mL IVPB (has no administration in time range)  HYDROmorphone (DILAUDID) injection 0.5-2 mg (has no administration in time range)  oxyCODONE (Oxy IR/ROXICODONE) immediate release tablet 5-10 mg (has no administration in time range)  ondansetron (ZOFRAN) injection 4 mg (has no administration in time range)    Or  ondansetron (ZOFRAN) 8 mg in sodium chloride 0.9 % 50 mL IVPB (has no administration in time range)  prochlorperazine (COMPAZINE) injection 5-10 mg (has no administration in time range)  metoCLOPramide (REGLAN) injection 10 mg (has no administration in time range)  lip balm (CARMEX) ointment 1 application (has no administration in time range)  magic mouthwash (has no administration in time range)  guaiFENesin-dextromethorphan (ROBITUSSIN DM) 100-10 MG/5ML syrup 10 mL (has no administration in time range)  hydrocortisone (ANUSOL-HC) 2.5 % rectal cream 1 application (has no administration in time range)  alum & mag hydroxide-simeth (MAALOX/MYLANTA) 200-200-20 MG/5ML suspension 30 mL (has no administration in time range)  hydrocortisone cream 1 % 1 application (has no administration in time range)  menthol-cetylpyridinium (CEPACOL) lozenge 3 mg (has no administration in time range)  phenol (CHLORASEPTIC) mouth spray  1-2 spray (has no administration in time range)  diphenhydrAMINE (BENADRYL) injection 12.5-25 mg (has no administration in time range)  diphenhydrAMINE (BENADRYL) capsule 25 mg (has no administration in time range)  LORazepam (ATIVAN) injection 0.5-1 mg (has no administration in time range)  sodium chloride 0.9 % bolus 500 mL (0 mLs Intravenous Stopped 09/01/17 1753)    Mobility walks

## 2017-09-01 NOTE — Anesthesia Preprocedure Evaluation (Addendum)
Anesthesia Evaluation  Patient identified by MRN, date of birth, ID band Patient awake    Reviewed: Allergy & Precautions, Patient's Chart, lab work & pertinent test results  History of Anesthesia Complications Negative for: history of anesthetic complications  Airway Mallampati: II  TM Distance: >3 FB Neck ROM: Full    Dental no notable dental hx. (+) Dental Advisory Given   Pulmonary neg pulmonary ROS,    Pulmonary exam normal        Cardiovascular negative cardio ROS Normal cardiovascular exam     Neuro/Psych Anxiety negative neurological ROS     GI/Hepatic negative GI ROS, Neg liver ROS,   Endo/Other  negative endocrine ROS  Renal/GU negative Renal ROS     Musculoskeletal negative musculoskeletal ROS (+)   Abdominal   Peds  Hematology   Anesthesia Other Findings   Reproductive/Obstetrics negative OB ROS                            Anesthesia Physical Anesthesia Plan  ASA: II  Anesthesia Plan: General   Post-op Pain Management:    Induction: Intravenous  PONV Risk Score and Plan: 2 and Treatment may vary due to age or medical condition, Ondansetron, Dexamethasone, Midazolam and Scopolamine patch - Pre-op  Airway Management Planned: Oral ETT  Additional Equipment:   Intra-op Plan:   Post-operative Plan:   Informed Consent: I have reviewed the patients History and Physical, chart, labs and discussed the procedure including the risks, benefits and alternatives for the proposed anesthesia with the patient or authorized representative who has indicated his/her understanding and acceptance.   Dental advisory given  Plan Discussed with: CRNA  Anesthesia Plan Comments:         Anesthesia Quick Evaluation

## 2017-09-01 NOTE — ED Triage Notes (Signed)
[facility] w/ confirmed appendicitis upon CT. Pt reports 5/10 RLQ pain, no nausea, and is afebrile in triage. Pt A+OX4, speaking in complete sentences, ambulatory independently.

## 2017-09-01 NOTE — ED Notes (Signed)
Attempted to call to give report. RN stated that RN would call ED to get report.

## 2017-09-02 ENCOUNTER — Encounter (HOSPITAL_COMMUNITY)
Admission: EM | Disposition: A | Discharge status: Home / Self Care | Payer: Self-pay | Source: Home / Self Care | Attending: Emergency Medicine

## 2017-09-02 ENCOUNTER — Encounter (HOSPITAL_COMMUNITY): Payer: Self-pay | Admitting: Certified Registered Nurse Anesthetist

## 2017-09-02 ENCOUNTER — Inpatient Hospital Stay (HOSPITAL_COMMUNITY): Payer: BLUE CROSS/BLUE SHIELD | Admitting: Anesthesiology

## 2017-09-02 DIAGNOSIS — K3580 Unspecified acute appendicitis: Secondary | ICD-10-CM | POA: Diagnosis not present

## 2017-09-02 HISTORY — PX: LAPAROSCOPIC APPENDECTOMY: SHX408

## 2017-09-02 LAB — SURGICAL PCR SCREEN
MRSA, PCR: NEGATIVE
STAPHYLOCOCCUS AUREUS: POSITIVE — AB

## 2017-09-02 LAB — HIV ANTIBODY (ROUTINE TESTING W REFLEX): HIV SCREEN 4TH GENERATION: NONREACTIVE

## 2017-09-02 SURGERY — APPENDECTOMY, LAPAROSCOPIC
Anesthesia: General | Site: Abdomen

## 2017-09-02 MED ORDER — FENTANYL CITRATE (PF) 100 MCG/2ML IJ SOLN
INTRAMUSCULAR | Status: AC
  Filled 2017-09-02: qty 2

## 2017-09-02 MED ORDER — FENTANYL CITRATE (PF) 100 MCG/2ML IJ SOLN
INTRAMUSCULAR | Status: DC | PRN
  Administered 2017-09-02 (×4): 50 ug via INTRAVENOUS

## 2017-09-02 MED ORDER — ROCURONIUM BROMIDE 10 MG/ML (PF) SYRINGE
PREFILLED_SYRINGE | INTRAVENOUS | Status: DC | PRN
  Administered 2017-09-02: 40 mg via INTRAVENOUS

## 2017-09-02 MED ORDER — KETOROLAC TROMETHAMINE 30 MG/ML IJ SOLN
INTRAMUSCULAR | Status: AC
  Filled 2017-09-02: qty 1

## 2017-09-02 MED ORDER — SCOPOLAMINE 1 MG/3DAYS TD PT72
MEDICATED_PATCH | TRANSDERMAL | Status: AC
  Administered 2017-09-02: 1.5 mg
  Filled 2017-09-02: qty 1

## 2017-09-02 MED ORDER — HYDROMORPHONE HCL 1 MG/ML IJ SOLN
0.2500 mg | INTRAMUSCULAR | Status: DC | PRN
  Administered 2017-09-02 (×2): 0.5 mg via INTRAVENOUS

## 2017-09-02 MED ORDER — KETOROLAC TROMETHAMINE 30 MG/ML IJ SOLN
INTRAMUSCULAR | Status: DC | PRN
  Administered 2017-09-02: 30 mg via INTRAVENOUS

## 2017-09-02 MED ORDER — MIDAZOLAM HCL 2 MG/2ML IJ SOLN
INTRAMUSCULAR | Status: AC
  Filled 2017-09-02: qty 2

## 2017-09-02 MED ORDER — HYDROMORPHONE HCL 1 MG/ML IJ SOLN
INTRAMUSCULAR | Status: AC
  Filled 2017-09-02: qty 1

## 2017-09-02 MED ORDER — ONDANSETRON HCL 4 MG/2ML IJ SOLN
INTRAMUSCULAR | Status: AC
  Filled 2017-09-02: qty 2

## 2017-09-02 MED ORDER — SUGAMMADEX SODIUM 200 MG/2ML IV SOLN
INTRAVENOUS | Status: AC
  Filled 2017-09-02: qty 2

## 2017-09-02 MED ORDER — HYDROCODONE-ACETAMINOPHEN 7.5-325 MG PO TABS: 1.0000 | ORAL_TABLET | Freq: Once | ORAL | Status: DC | PRN

## 2017-09-02 MED ORDER — 0.9 % SODIUM CHLORIDE (POUR BTL) OPTIME
TOPICAL | Status: DC | PRN
  Administered 2017-09-02: 1000 mL

## 2017-09-02 MED ORDER — PROPOFOL 10 MG/ML IV BOLUS
INTRAVENOUS | Status: DC | PRN
  Administered 2017-09-02: 200 mg via INTRAVENOUS

## 2017-09-02 MED ORDER — ACETAMINOPHEN 10 MG/ML IV SOLN
1000.0000 mg | Freq: Once | INTRAVENOUS | Status: DC | PRN
  Administered 2017-09-02: 1000 mg via INTRAVENOUS

## 2017-09-02 MED ORDER — MEPERIDINE HCL 50 MG/ML IJ SOLN
6.2500 mg | INTRAMUSCULAR | Status: DC | PRN
  Administered 2017-09-02: 6.25 mg via INTRAVENOUS

## 2017-09-02 MED ORDER — MEPERIDINE HCL 50 MG/ML IJ SOLN
INTRAMUSCULAR | Status: AC
  Filled 2017-09-02: qty 1

## 2017-09-02 MED ORDER — DEXAMETHASONE SODIUM PHOSPHATE 4 MG/ML IJ SOLN
INTRAMUSCULAR | Status: DC | PRN
  Administered 2017-09-02: 10 mg via INTRAVENOUS

## 2017-09-02 MED ORDER — PROPOFOL 10 MG/ML IV BOLUS
INTRAVENOUS | Status: AC
  Filled 2017-09-02: qty 20

## 2017-09-02 MED ORDER — LIDOCAINE 2% (20 MG/ML) 5 ML SYRINGE
INTRAMUSCULAR | Status: AC
  Filled 2017-09-02: qty 5

## 2017-09-02 MED ORDER — LACTATED RINGERS IR SOLN
Status: DC | PRN
  Administered 2017-09-02: 1000 mL

## 2017-09-02 MED ORDER — LIDOCAINE 2% (20 MG/ML) 5 ML SYRINGE
INTRAMUSCULAR | Status: DC | PRN
  Administered 2017-09-02: 80 mg via INTRAVENOUS

## 2017-09-02 MED ORDER — SUGAMMADEX SODIUM 200 MG/2ML IV SOLN
INTRAVENOUS | Status: DC | PRN
  Administered 2017-09-02: 200 mg via INTRAVENOUS

## 2017-09-02 MED ORDER — ACETAMINOPHEN 10 MG/ML IV SOLN
INTRAVENOUS | Status: AC
  Filled 2017-09-02: qty 100

## 2017-09-02 MED ORDER — MIDAZOLAM HCL 5 MG/5ML IJ SOLN
INTRAMUSCULAR | Status: DC | PRN
  Administered 2017-09-02: 2 mg via INTRAVENOUS

## 2017-09-02 MED ORDER — LACTATED RINGERS IV SOLN
INTRAVENOUS | Status: DC
  Administered 2017-09-02: 13:00:00 via INTRAVENOUS

## 2017-09-02 MED ORDER — BUPIVACAINE HCL (PF) 0.5 % IJ SOLN
INTRAMUSCULAR | Status: DC | PRN
  Administered 2017-09-02: 20 mL

## 2017-09-02 MED ORDER — DEXAMETHASONE SODIUM PHOSPHATE 10 MG/ML IJ SOLN
INTRAMUSCULAR | Status: AC
  Filled 2017-09-02: qty 1

## 2017-09-02 MED ORDER — PROMETHAZINE HCL 25 MG/ML IJ SOLN: 6.2500 mg | INTRAMUSCULAR | Status: DC | PRN

## 2017-09-02 MED ORDER — ROCURONIUM BROMIDE 10 MG/ML (PF) SYRINGE
PREFILLED_SYRINGE | INTRAVENOUS | Status: AC
  Filled 2017-09-02: qty 5

## 2017-09-02 MED ORDER — FENTANYL CITRATE (PF) 100 MCG/2ML IJ SOLN
INTRAMUSCULAR | Status: AC
  Filled 2017-09-02: qty 4

## 2017-09-02 MED ORDER — MUPIROCIN 2 % EX OINT
1.0000 "application " | TOPICAL_OINTMENT | Freq: Two times a day (BID) | CUTANEOUS | Status: DC
  Administered 2017-09-02 – 2017-09-03 (×3): 1 via NASAL
  Filled 2017-09-02: qty 22

## 2017-09-02 SURGICAL SUPPLY — 35 items
ADH SKN CLS APL DERMABOND .7 (GAUZE/BANDAGES/DRESSINGS) ×1
APPLIER CLIP 5 13 M/L LIGAMAX5 (MISCELLANEOUS)
APR CLP MED LRG 5 ANG JAW (MISCELLANEOUS)
BAG SPEC RTRVL LRG 6X4 10 (ENDOMECHANICALS) ×1
CHLORAPREP W/TINT 26ML (MISCELLANEOUS) ×2 IMPLANT
CLIP APPLIE 5 13 M/L LIGAMAX5 (MISCELLANEOUS) IMPLANT
COVER SURGICAL LIGHT HANDLE (MISCELLANEOUS) ×2 IMPLANT
CUTTER FLEX LINEAR 45M (STAPLE) IMPLANT
DECANTER SPIKE VIAL GLASS SM (MISCELLANEOUS) ×2 IMPLANT
DERMABOND ADVANCED (GAUZE/BANDAGES/DRESSINGS) ×1
DERMABOND ADVANCED .7 DNX12 (GAUZE/BANDAGES/DRESSINGS) ×1 IMPLANT
DRAPE LAPAROSCOPIC ABDOMINAL (DRAPES) ×2 IMPLANT
ELECT COAG MONOPOLAR (ELECTROSURGICAL) ×2
ELECT REM PT RETURN 15FT ADLT (MISCELLANEOUS) ×2 IMPLANT
ELECTRODE COAG MONOPOLAR (ELECTROSURGICAL) ×1 IMPLANT
GLOVE SURG SIGNA 7.5 PF LTX (GLOVE) ×4 IMPLANT
GOWN STRL REUS W/TWL XL LVL3 (GOWN DISPOSABLE) ×4 IMPLANT
KIT BASIN OR (CUSTOM PROCEDURE TRAY) ×2 IMPLANT
POUCH SPECIMEN RETRIEVAL 10MM (ENDOMECHANICALS) ×2 IMPLANT
RELOAD 45 VASCULAR/THIN (ENDOMECHANICALS) IMPLANT
RELOAD STAPLE 45 2.5 WHT GRN (ENDOMECHANICALS) IMPLANT
RELOAD STAPLE 45 3.5 BLU ETS (ENDOMECHANICALS) IMPLANT
RELOAD STAPLE TA45 3.5 REG BLU (ENDOMECHANICALS) ×2 IMPLANT
SET IRRIG TUBING LAPAROSCOPIC (IRRIGATION / IRRIGATOR) ×2 IMPLANT
SHEARS HARMONIC ACE PLUS 36CM (ENDOMECHANICALS) ×1 IMPLANT
SLEEVE XCEL OPT CAN 5 100 (ENDOMECHANICALS) ×2 IMPLANT
SUT MNCRL AB 4-0 PS2 18 (SUTURE) ×2 IMPLANT
TOWEL OR 17X26 10 PK STRL BLUE (TOWEL DISPOSABLE) ×2 IMPLANT
TOWEL OR NON WOVEN STRL DISP B (DISPOSABLE) ×2 IMPLANT
TRAY FOLEY W/METER SILVER 16FR (SET/KITS/TRAYS/PACK) ×2 IMPLANT
TRAY LAPAROSCOPIC (CUSTOM PROCEDURE TRAY) ×2 IMPLANT
TROCAR BLADELESS OPT 5 100 (ENDOMECHANICALS) ×2 IMPLANT
TROCAR XCEL BLUNT TIP 100MML (ENDOMECHANICALS) ×2 IMPLANT
TUBING INSUF HEATED (TUBING) ×2 IMPLANT
TUBING INSUFFLATION (TUBING) ×1 IMPLANT

## 2017-09-02 NOTE — Anesthesia Procedure Notes (Signed)
Procedure Name: Intubation Date/Time: 09/02/2017 1:36 PM Performed by: Vanessa Durhamochran, Kathrynn Backstrom Glenn, CRNA Pre-anesthesia Checklist: Patient identified, Emergency Drugs available, Suction available and Patient being monitored Patient Re-evaluated:Patient Re-evaluated prior to induction Oxygen Delivery Method: Circle system utilized Preoxygenation: Pre-oxygenation with 100% oxygen Induction Type: IV induction Ventilation: Mask ventilation without difficulty Laryngoscope Size: 2 and Miller Grade View: Grade I Tube type: Oral Tube size: 7.0 mm Number of attempts: 1 Airway Equipment and Method: Stylet Placement Confirmation: ETT inserted through vocal cords under direct vision,  positive ETCO2 and breath sounds checked- equal and bilateral Secured at: 22 cm Tube secured with: Tape Dental Injury: Teeth and Oropharynx as per pre-operative assessment

## 2017-09-02 NOTE — Progress Notes (Signed)
[facility] Progress Note  Day of Surgery  Subjective: CC: Abdominal pain is unchanged compared to yesterday.  Reports right lower quadrant pain that hurts with any movement.  Denies fever, chills, nausea, vomiting, changes in bowel habits.  Denies any history of ovarian cysts or other gynecologic issues.  States her last menstrual period ended on Monday.  Objective: Vital signs in last 24 hours: Temp:  [98 F (36.7 C)-98.6 F (37 C)] 98 F (36.7 C) (04/04 0546) Pulse Rate:  [62-91] 62 (04/04 0546) Resp:  [16-17] 16 (04/04 0546) BP: (113-165)/(70-129) 122/76 (04/04 0600) SpO2:  [97 %-100 %] 100 % (04/04 0546) Weight:  [76 kg (167 lb 8.8 oz)] 76 kg (167 lb 8.8 oz) (04/03 2015) Last BM Date: 09/01/17  Intake/Output from previous day: 04/03 0701 - 04/04 0700 In: 1700 [I.V.:900; IV Piggyback:800] Out: -  Intake/Output this shift: No intake/output data recorded.  PE: Gen:  Alert, NAD, pleasant Card:  Regular rate and rhythm, pedal pulses 2+ BL Pulm:  Normal effort, clear to auscultation bilaterally Abd: Soft, hypoactive bowel sounds, palpation of left lower quadrant elicits pain in the right lower quadrant, TTP right lower quadrant. Skin: warm and dry, no rashes  Psych: A&Ox3   Lab Results:  Recent Labs    09/01/17 1527  WBC 8.5  HGB 13.4  HCT 40.6  PLT 361   BMET Recent Labs    09/01/17 1527  NA 140  K 4.0  CL 104  CO2 27  GLUCOSE 130*  BUN 11  CREATININE 0.61  CALCIUM 9.2   PT/INR No results for input(s): LABPROT, INR in the last 72 hours. CMP     Component Value Date/Time   NA 140 09/01/2017 1527   K 4.0 09/01/2017 1527   CL 104 09/01/2017 1527   CO2 27 09/01/2017 1527   GLUCOSE 130 (H) 09/01/2017 1527   BUN 11 09/01/2017 1527   CREATININE 0.61 09/01/2017 1527   CALCIUM 9.2 09/01/2017 1527   PROT 7.6 09/01/2017 1527   ALBUMIN 4.0 09/01/2017 1527   AST 17 09/01/2017 1527   ALT 20 09/01/2017 1527   ALKPHOS 73 09/01/2017 1527    BILITOT 0.8 09/01/2017 1527   GFRNONAA >60 09/01/2017 1527   GFRAA >60 09/01/2017 1527   Lipase     Component Value Date/Time   LIPASE 34 09/01/2017 1527       Studies/Results: Ct Abdomen Pelvis W Contrast  Result Date: 09/01/2017 CLINICAL DATA:  RIGHT lower quadrant pain for 3 days. Assess for appendicitis versus ovarian torsion. EXAM: CT ABDOMEN AND PELVIS WITH CONTRAST TECHNIQUE: Multidetector CT imaging of the abdomen and pelvis was performed using the standard protocol following bolus administration of intravenous contrast. CONTRAST:  100mL ISOVUE-300 IOPAMIDOL (ISOVUE-300) INJECTION 61% COMPARISON:  CT abdomen and pelvis February 12, 2010 FINDINGS: LOWER CHEST: Lung bases are clear. Included heart size is normal. No pericardial effusion. HEPATOBILIARY: Liver and gallbladder are normal. PANCREAS: Normal. SPLEEN: Normal. ADRENALS/URINARY TRACT: Kidneys are orthotopic, demonstrating symmetric enhancement. No nephrolithiasis, hydronephrosis or solid renal masses. The unopacified ureters are normal in course and caliber. Urinary bladder is partially distended and unremarkable. Normal adrenal glands. STOMACH/BOWEL: The stomach, small and large bowel are normal in course and caliber without inflammatory changes. Appendix: Location: Retrocecal Diameter: 7 mm, previously 3 mm Appendicolith: None Mucosal hyper-enhancement: Slight mural enhancement and periappendiceal inflammatory changes Extraluminal gas: None Periappendiceal collection: None VASCULAR/LYMPHATIC: Aortoiliac vessels are normal in course and caliber. No lymphadenopathy by CT size criteria. REPRODUCTIVE: Normal. OTHER:  No intraperitoneal free fluid or free air. MUSCULOSKELETAL: Nonacute. Very small fat containing umbilical hernia. IMPRESSION: Early acute appendicitis. These results will be called to the ordering clinician or representative by the Radiologist Assistant, and communication documented in the PACS or zVision Dashboard.  Electronically Signed   By: Awilda Metro M.D.   On: 09/01/2017 14:17    Anti-infectives: Anti-infectives (From admission, onward)   Start     Dose/Rate Route Frequency Ordered Stop   09/02/17 0600  cefTRIAXone (ROCEPHIN) 2 g in sodium chloride 0.9 % 100 mL IVPB     2 g 200 mL/hr over 30 Minutes Intravenous On call to O.R. 09/01/17 1747 09/03/17 0559   09/02/17 0600  metroNIDAZOLE (FLAGYL) IVPB 500 mg     500 mg 100 mL/hr over 60 Minutes Intravenous On call to O.R. 09/01/17 1747 09/03/17 0559   09/01/17 2000  cefTRIAXone (ROCEPHIN) 2 g in sodium chloride 0.9 % 100 mL IVPB     2 g 200 mL/hr over 30 Minutes Intravenous Every 24 hours 09/01/17 1747     09/01/17 2000  metroNIDAZOLE (FLAGYL) IVPB 500 mg     500 mg 100 mL/hr over 60 Minutes Intravenous Every 8 hours 09/01/17 1747       Assessment/Plan Uncomplicated acute appendicitis -VSS, no leukocytosis, possible early acute appendicitis on CT scan without appendicolith -Physical exam consistent with acute appendicitis given right lower quadrant pain and positive Rovsing sign -Discussed treatment options with patient and she wishes to proceed with laparoscopic appendectomy today by Dr. Magnus Ivan  FEN: N.p.o., IVF ZO:[id number] VTE: scds, resume Lovenox postoperatively   LOS: 1 day    Adam Phenix , [facility] 09/02/2017, 8:30 AM Pager: [phone] Consults: [phone] Mon-Fri 7:00 am-4:30 pm Sat-Sun 7:00 am-11:30 am

## 2017-09-02 NOTE — Progress Notes (Signed)
There is an order for patient to take her own medication. She took 3 Fluoxetine HCL 20mg  pills and her birth control (Lillow) pill.

## 2017-09-02 NOTE — Transfer of Care (Signed)
Immediate Anesthesia Transfer of Care Note  Patient: Marie BridgemanJennifer A Schwartz  Procedure(s) Performed: APPENDECTOMY LAPAROSCOPIC (N/A Abdomen)  Patient Location: PACU  Anesthesia Type:General  Level of Consciousness: awake, alert , oriented and patient cooperative  Airway & Oxygen Therapy: Patient Spontanous Breathing and Patient connected to face mask  Post-op Assessment: Report given to RN and Post -op Vital signs reviewed and stable  Post vital signs: Reviewed and stable  Last Vitals:  Vitals Value Taken Time  BP    Temp    Pulse 86 09/02/2017  2:17 PM  Resp    SpO2 100 % 09/02/2017  2:17 PM  Vitals shown include unvalidated device data.  Last Pain:  Vitals:   09/02/17 0800  TempSrc:   PainSc: 0-No pain         Complications: No apparent anesthesia complications

## 2017-09-02 NOTE — Op Note (Signed)
Appendectomy, Lap, Procedure Note  Indications: The patient presented with Schwartz history of right-sided abdominal pain. Schwartz CT revealed findings consistent with acute appendicitis.  Pre-operative Diagnosis: acute appendicitis  Post-operative Diagnosis: Same  Surgeon: Marie Schwartz,Marie Schwartz   Assistants: 0  Anesthesia: General endotracheal anesthesia  ASA Class: 2  Procedure Details  The patient was seen again in the Holding Room. The risks, benefits, complications, treatment options, and expected outcomes were discussed with the patient and/or family. The possibilities of reaction to medication, perforation of viscus, bleeding, recurrent infection, finding Schwartz normal appendix, the need for additional procedures, failure to diagnose Schwartz condition, and creating Schwartz complication requiring transfusion or operation were discussed. There was concurrence with the proposed plan and informed consent was obtained. The site of surgery was properly noted. The patient was taken to Operating Room, identified as Marie BridgemanJennifer Schwartz Schwartz and the procedure verified as Appendectomy. Schwartz Time Out was held and the above information confirmed.  The patient was placed in the supine position and general anesthesia was induced, along with placement of orogastric tube, Venodyne boots, and Schwartz Foley catheter. The abdomen was prepped and draped in Schwartz sterile fashion. Schwartz one centimeter infraumbilical incision was made.  The umbilical stalk was elevated, and the midline fascia was incised with Schwartz #11 blade.  Schwartz Kelly clamp was used to confirm entrance into the peritoneal cavity.  Schwartz pursestring suture was passed around the incision with Schwartz 0 Vicryl.  The Hasson was introduced into the abdomen and the tails of the suture were used to hold the Hasson in place.   The pneumoperitoneum was then established to steady pressure of 15 mmHg.  Additional 5 mm cannulas then placed in the left lower quadrant of the abdomen and the suprapubic region under direct  visualization. Schwartz careful evaluation of the entire abdomen was carried out. The patient was placed in Trendelenburg and left lateral decubitus position. The small intestines were retracted in the cephalad and left lateral direction away from the pelvis and right lower quadrant. The patient was found to be minimally inflamed appendix that was extending into the pelvis. There was no evidence of perforation.  The appendix was carefully dissected. The appendix was was skeletonized with the harmonic scalpel.   The appendix was divided at its base using an endo-GIA stapler. Minimal appendiceal stump was left in place. There was no evidence of bleeding, leakage, or complication after division of the appendix. Irrigation was also performed and irrigate suctioned from the abdomen as well. I evaluated both her ovaries and they appeared normal  The umbilical port site was closed with the purse string suture. There was no residual palpable fascial defect.  The trocar site skin wounds were closed with 4-0 Monocryl.  Instrument, sponge, and needle counts were correct at the conclusion of the case.   Findings: The appendix was found to be minimally inflamed. There were not signs of necrosis.  There was not perforation. There was not abscess formation.  Estimated Blood Loss:  Minimal         Drains:none         Complications:  None; patient tolerated the procedure well.         Disposition: PACU - hemodynamically stable.         Condition: stable

## 2017-09-02 NOTE — Anesthesia Postprocedure Evaluation (Signed)
Anesthesia Post Note  Patient: Marie BridgemanJennifer A Schwartz  Procedure(s) Performed: APPENDECTOMY LAPAROSCOPIC (N/A Abdomen)     Patient location during evaluation: PACU Anesthesia Type: General Level of consciousness: sedated Pain management: pain level controlled Vital Signs Assessment: post-procedure vital signs reviewed and stable Respiratory status: spontaneous breathing and respiratory function stable Cardiovascular status: stable Postop Assessment: no apparent nausea or vomiting Anesthetic complications: no    Last Vitals:  Vitals:   09/02/17 1500 09/02/17 1515  BP: 115/61 115/71  Pulse: 67 66  Resp: 17 14  Temp:    SpO2: 100% 100%                  Corinthian Mizrahi DANIEL

## 2017-09-03 MED ORDER — IBUPROFEN 200 MG PO TABS: 400.0000 mg | ORAL_TABLET | Freq: Four times a day (QID) | ORAL | Status: DC | PRN

## 2017-09-03 MED ORDER — ACETAMINOPHEN 325 MG PO TABS: 1000.0000 mg | ORAL_TABLET | Freq: Four times a day (QID) | ORAL | Status: DC | PRN

## 2017-09-03 MED ORDER — OXYCODONE HCL 5 MG PO TABS: 5.0000 mg | ORAL_TABLET | ORAL | 0 refills | Status: DC | PRN

## 2017-09-03 MED ORDER — IBUPROFEN 400 MG PO TABS: 400.0000 mg | ORAL_TABLET | Freq: Four times a day (QID) | ORAL | 0 refills | Status: DC | PRN

## 2017-09-03 NOTE — Progress Notes (Signed)
Discharge and medication instructions reviewed with patient and her mother. Questions answered; both deny further questions. One prescription given. Patient's mother is driving her home. Lina SarBeth Phelix Fudala, RN

## 2017-09-03 NOTE — Discharge Summary (Signed)
[facility] Discharge Summary   Patient ID: Marie Schwartz MRN: [id number] DOB/AGE: 464-Feb-1992 27 y.o.  Admit date: 09/01/2017 Discharge date: 09/03/2017   Discharge Diagnosis Patient Active Problem List   Diagnosis Date Noted  . Acute appendicitis 09/01/2017  . OCD (obsessive compulsive disorder) 09/01/2017   Imaging: Ct Abdomen Pelvis W Contrast  Result Date: 09/01/2017 CLINICAL DATA:  RIGHT lower quadrant pain for 3 days. Assess for appendicitis versus ovarian torsion. EXAM: CT ABDOMEN AND PELVIS WITH CONTRAST TECHNIQUE: Multidetector CT imaging of the abdomen and pelvis was performed using the standard protocol following bolus administration of intravenous contrast. CONTRAST:  100mL ISOVUE-300 IOPAMIDOL (ISOVUE-300) INJECTION 61% COMPARISON:  CT abdomen and pelvis February 12, 2010 FINDINGS: LOWER CHEST: Lung bases are clear. Included heart size is normal. No pericardial effusion. HEPATOBILIARY: Liver and gallbladder are normal. PANCREAS: Normal. SPLEEN: Normal. ADRENALS/URINARY TRACT: Kidneys are orthotopic, demonstrating symmetric enhancement. No nephrolithiasis, hydronephrosis or solid renal masses. The unopacified ureters are normal in course and caliber. Urinary bladder is partially distended and unremarkable. Normal adrenal glands. STOMACH/BOWEL: The stomach, small and large bowel are normal in course and caliber without inflammatory changes. Appendix: Location: Retrocecal Diameter: 7 mm, previously 3 mm Appendicolith: None Mucosal hyper-enhancement: Slight mural enhancement and periappendiceal inflammatory changes Extraluminal gas: None Periappendiceal collection: None VASCULAR/LYMPHATIC: Aortoiliac vessels are normal in course and caliber. No lymphadenopathy by CT size criteria. REPRODUCTIVE: Normal. OTHER: No intraperitoneal free fluid or free air. MUSCULOSKELETAL: Nonacute. Very small fat containing umbilical hernia. IMPRESSION: Early acute appendicitis. These results will  be called to the ordering clinician or representative by the Radiologist Assistant, and communication documented in the PACS or zVision Dashboard. Electronically Signed   By: Awilda Metroourtnay  Bloomer M.D.   On: 09/01/2017 14:17    Procedures  Dr. Abigail Miyamotoouglas Blackman 09/02/17 - Laparoscopic Appendectomy  Hospital Course:  27 y/o female with PMH OCD  [facility].  CT above and physical exam consistent with acute appendicitis  Patient was admitted and underwent procedure listed above.  Tolerated procedure well and was transferred to the floor.  Diet was advanced as tolerated.  On POD#1, the patient was voiding well, tolerating diet, ambulating well, pain well controlled, vital signs stable, incisions c/d/i and felt stable for discharge home.  Patient will follow up in our office in 2 weeks and knows to call with questions or concerns.   Physical Exam: General:  Alert, NAD, pleasant, comfortable Abd:  Soft, ND, appropriately tender, incisions C/D/I  Allergies as of 09/03/2017   No Known Allergies     Medication List    TAKE these medications   acetaminophen 325 MG tablet Commonly known as:  TYLENOL Take 3 tablets (975 mg total) by mouth every 6 (six) hours as needed for mild pain (or temp > 100). What changed:    medication strength  how much to take  when to take this  reasons to take this   FLUoxetine 20 MG tablet Commonly known as:  PROZAC Take 60 mg by mouth daily.   ibuprofen 400 MG tablet Commonly known as:  ADVIL,MOTRIN Take 1-2 tablets (400-800 mg total) by mouth every 6 (six) hours as needed for fever, mild pain or moderate pain.   LILLOW 0.15-30 MG-MCG tablet Generic drug:  levonorgestrel-ethinyl estradiol Take 1 tablet by mouth daily.   oxyCODONE 5 MG immediate release tablet Commonly known as:  Oxy IR/ROXICODONE Take 1 tablet (5 mg total) by mouth every 4 (four) hours as needed for  moderate pain, severe pain or breakthrough pain.         Follow-up Information    [facility] Follow up.   Specialty:  General Surgery Why:  call to confirm post-operative appointment date/time. Contact information: [address] [place] [address] [phone]          Signed: Hosie Spangle, [facility] 09/03/2017, 9:58 AM Pager: [phone] Consults: [phone] Mon-Fri 7:00 am-4:30 pm Sat-Sun 7:00 am-11:30 am

## 2017-09-03 NOTE — Discharge Instructions (Signed)

## 2017-09-06 ENCOUNTER — Encounter (HOSPITAL_COMMUNITY): Payer: Self-pay | Admitting: Surgery

## 2017-09-08 ENCOUNTER — Emergency Department (HOSPITAL_COMMUNITY): Payer: BLUE CROSS/BLUE SHIELD

## 2017-09-08 ENCOUNTER — Other Ambulatory Visit: Payer: Self-pay

## 2017-09-08 ENCOUNTER — Inpatient Hospital Stay (HOSPITAL_COMMUNITY)
Admission: EM | Admit: 2017-09-08 | Discharge: 2017-09-10 | DRG: 176 | Disposition: A | Discharge status: Home / Self Care | Payer: BLUE CROSS/BLUE SHIELD | Attending: Internal Medicine | Admitting: Internal Medicine

## 2017-09-08 ENCOUNTER — Encounter (HOSPITAL_COMMUNITY): Payer: Self-pay | Admitting: Emergency Medicine

## 2017-09-08 DIAGNOSIS — Z79899 Other long term (current) drug therapy: Secondary | ICD-10-CM

## 2017-09-08 DIAGNOSIS — I82432 Acute embolism and thrombosis of left popliteal vein: Secondary | ICD-10-CM | POA: Diagnosis present

## 2017-09-08 DIAGNOSIS — Z793 Long term (current) use of hormonal contraceptives: Secondary | ICD-10-CM

## 2017-09-08 DIAGNOSIS — F429 Obsessive-compulsive disorder, unspecified: Secondary | ICD-10-CM | POA: Diagnosis present

## 2017-09-08 DIAGNOSIS — R9431 Abnormal electrocardiogram [ECG] [EKG]: Secondary | ICD-10-CM | POA: Diagnosis not present

## 2017-09-08 DIAGNOSIS — I2692 Saddle embolus of pulmonary artery without acute cor pulmonale: Secondary | ICD-10-CM | POA: Diagnosis not present

## 2017-09-08 DIAGNOSIS — I2699 Other pulmonary embolism without acute cor pulmonale: Secondary | ICD-10-CM | POA: Diagnosis not present

## 2017-09-08 DIAGNOSIS — R0789 Other chest pain: Secondary | ICD-10-CM | POA: Diagnosis not present

## 2017-09-08 DIAGNOSIS — R0602 Shortness of breath: Secondary | ICD-10-CM | POA: Diagnosis not present

## 2017-09-08 LAB — BASIC METABOLIC PANEL
Anion gap: 11 (ref 5–15)
BUN: 10 mg/dL (ref 6–20)
CHLORIDE: 103 mmol/L (ref 101–111)
CO2: 24 mmol/L (ref 22–32)
Calcium: 9.4 mg/dL (ref 8.9–10.3)
Creatinine, Ser: 0.67 mg/dL (ref 0.44–1.00)
GFR calc Af Amer: >60 mL/min (ref >60)
GLUCOSE: 114 mg/dL — AB (ref 65–99)
POTASSIUM: 3.9 mmol/L (ref 3.5–5.1)
Sodium: 138 mmol/L (ref 135–145)

## 2017-09-08 LAB — CBC
HEMATOCRIT: 39.2 % (ref 36.0–46.0)
HEMOGLOBIN: 13.4 g/dL (ref 12.0–15.0)
MCH: 29.8 pg (ref 26.0–34.0)
MCHC: 34.2 g/dL (ref 30.0–36.0)
MCV: 87.1 fL (ref 78.0–100.0)
Platelets: 302 10*3/uL (ref 150–400)
RBC: 4.5 MIL/uL (ref 3.87–5.11)
RDW: 12.8 % (ref 11.5–15.5)
WBC: 9 10*3/uL (ref 4.0–10.5)

## 2017-09-08 LAB — D-DIMER, QUANTITATIVE (NOT AT ARMC): D DIMER QUANT: 4.23 ug{FEU}/mL — AB (ref 0.00–0.50)

## 2017-09-08 LAB — I-STAT TROPONIN, ED: Troponin i, poc: 0 ng/mL (ref 0.00–0.08)

## 2017-09-08 LAB — TROPONIN I

## 2017-09-08 LAB — BRAIN NATRIURETIC PEPTIDE: B Natriuretic Peptide: 13.7 pg/mL (ref 0.0–100.0)

## 2017-09-08 LAB — I-STAT BETA HCG BLOOD, ED (MC, WL, AP ONLY): I-stat hCG, quantitative: <5 m[IU]/mL (ref <5)

## 2017-09-08 MED ORDER — IOPAMIDOL (ISOVUE-370) INJECTION 76%
100.0000 mL | Freq: Once | INTRAVENOUS | Status: AC | PRN
  Administered 2017-09-08: 100 mL via INTRAVENOUS

## 2017-09-08 MED ORDER — SODIUM CHLORIDE 0.9 % IV BOLUS
500.0000 mL | Freq: Once | INTRAVENOUS | Status: AC
  Administered 2017-09-08: 500 mL via INTRAVENOUS

## 2017-09-08 MED ORDER — ACETAMINOPHEN 325 MG PO TABS
650.0000 mg | ORAL_TABLET | Freq: Four times a day (QID) | ORAL | Status: DC | PRN
  Administered 2017-09-08: 650 mg via ORAL
  Filled 2017-09-08: qty 2

## 2017-09-08 MED ORDER — FLUOXETINE HCL 20 MG PO CAPS
60.0000 mg | ORAL_CAPSULE | Freq: Every day | ORAL | Status: DC
  Administered 2017-09-09 – 2017-09-10 (×2): 60 mg via ORAL
  Filled 2017-09-08 (×2): qty 3

## 2017-09-08 MED ORDER — ENOXAPARIN SODIUM 80 MG/0.8ML ~~LOC~~ SOLN
75.0000 mg | Freq: Two times a day (BID) | SUBCUTANEOUS | Status: DC
Start: 2017-09-09 — End: 2017-09-10
  Administered 2017-09-09 – 2017-09-10 (×2): 75 mg via SUBCUTANEOUS
  Filled 2017-09-08 (×2): qty 0.8

## 2017-09-08 MED ORDER — ENOXAPARIN SODIUM 80 MG/0.8ML ~~LOC~~ SOLN
75.0000 mg | SUBCUTANEOUS | Status: AC
  Administered 2017-09-08: 75 mg via SUBCUTANEOUS
  Filled 2017-09-08: qty 0.75

## 2017-09-08 MED ORDER — OXYCODONE HCL 5 MG PO TABS
5.0000 mg | ORAL_TABLET | ORAL | Status: DC | PRN
  Administered 2017-09-09: 5 mg via ORAL
  Filled 2017-09-08: qty 1

## 2017-09-08 MED ORDER — ACETAMINOPHEN 650 MG RE SUPP: 650.0000 mg | Freq: Four times a day (QID) | RECTAL | Status: DC | PRN

## 2017-09-08 MED ORDER — ENOXAPARIN SODIUM 80 MG/0.8ML ~~LOC~~ SOLN
75.0000 mg | Freq: Once | SUBCUTANEOUS | Status: AC
  Administered 2017-09-09: 75 mg via SUBCUTANEOUS
  Filled 2017-09-08: qty 0.8

## 2017-09-08 MED ORDER — IOPAMIDOL (ISOVUE-370) INJECTION 76%
INTRAVENOUS | Status: AC
  Filled 2017-09-08: qty 100

## 2017-09-08 NOTE — Progress Notes (Signed)
ANTICOAGULATION CONSULT NOTE - Initial Consult  Pharmacy Consult for enoxaparin Indication: pulmonary embolus  No Known Allergies  Patient Measurements: Height: 5\' 4"  (162.6 cm) Weight: 167 lb (75.8 kg) IBW/kg (Calculated) : 54.7 Heparin Dosing Weight: 75kg  Vital Signs: Temp: 98.5 F (36.9 C) (04/10 1310) Temp Source: Oral (04/10 1310) BP: 136/86 (04/10 1310) Pulse Rate: 100 (04/10 1310)  Labs: Recent Labs    09/08/17 1339  HGB 13.4  HCT 39.2  PLT 302  CREATININE 0.67    Estimated Creatinine Clearance: 107.1 mL/min (by C-G formula based on SCr of 0.67 mg/dL).   Medical History: Past Medical History:  Diagnosis Date  . OCD (obsessive compulsive disorder) 09/01/2017    Assessment: 27 YOF presents with chest pain and shortness of breath.  CTA chest reveals BL PE without evidence of heart strain.  She was recently discharged (4/5) following appendectomy 4/4.  Pharmacy asked to dose enoxaparin.    Today, 09/08/2017  CBC: Hgb and pltc WNL  Renal: SCr WNL  Goal of Therapy:  Anti-Xa level 0.6-1 units/ml 4hrs after LMWH dose given Monitor platelets by anticoagulation protocol: Yes   Plan:   Enoxaparin 1mg /kg (75mg ) SQ q12h  Use 1mg /kg q12h for acute PE indication  Check CBC at least q72h while in hospital  Monitor renal function  Await plans for final decision re: anticoagulation  Juliette Alcideustin Cami Delawder, PharmD, BCPS.   Pager: [phone] 09/08/2017 4:11 PM

## 2017-09-08 NOTE — ED Triage Notes (Signed)
Pt reports that she had her appendix taken out last Thursday and today when she was walking around the house she was getting SOB and then when blow drying her hair felt very fatigued and had to sit down. Called [facility] office and was told to go to ED to rule out blood clot.

## 2017-09-08 NOTE — ED Notes (Signed)
ED TO INPATIENT HANDOFF REPORT  Name/Age/Gender Marie Schwartz 27 y.o. female  Code Status Code Status History    Date Active Date Inactive Code Status Order ID Comments User Context   09/01/2017 1747 09/03/2017 1320 Full Code [id number]  Michael Boston, MD ED      Home/SNF/Other Home  Chief Complaint SOB  Level of Care/Admitting Diagnosis ED Disposition    ED Disposition Condition [facility] Area: [facility] [[id number]]  Level of Care: Telemetry [5]  Admit to tele based on following criteria: Monitor for Ischemic changes  Diagnosis: PE (pulmonary thromboembolism) [facility]) [[id number]]  Admitting Physician: Elodia Florence [id number]  Attending Physician: Cephus Slater, A CALDWELL [phone]  Estimated length of stay: past midnight tomorrow  Certification:: I certify this patient will need inpatient services for at least 2 midnights  PT Class (Do Not Modify): Inpatient [101]  PT Acc Code (Do Not Modify): Private [1]       Medical History Past Medical History:  Diagnosis Date  . OCD (obsessive compulsive disorder) 09/01/2017    Allergies No Known Allergies  IV Location/Drains/Wounds Patient Lines/Drains/Airways Status   Active Line/Drains/Airways    Name:   Placement date:   Placement time:   Site:   Days:   Peripheral IV 09/08/17 Right Antecubital   09/08/17    1338    Antecubital   less than 1   Incision (Closed) 09/02/17 Abdomen Other (Comment)   09/02/17    1349     6   Incision - 3 Ports Abdomen 1: Umbilicus 2: Right;Mid 3: Left;Lower;Mid   09/02/17    1350     6          Labs/Imaging Results for orders placed or performed during the hospital encounter of 09/08/17 (from the past 48 hour(s))  Basic metabolic panel     Status: Abnormal   Collection Time: 09/08/17  1:39 PM  Result Value Ref Range   Sodium 138 135 - 145 mmol/L   Potassium 3.9 3.5 - 5.1 mmol/L   Chloride 103 101 - 111 mmol/L   CO2 24 22 - 32 mmol/L   Glucose, Bld 114  (H) 65 - 99 mg/dL   BUN 10 6 - 20 mg/dL   Creatinine, Ser 0.67 0.44 - 1.00 mg/dL   Calcium 9.4 8.9 - 10.3 mg/dL   GFR calc non Af Amer >60 >60 mL/min   GFR calc Af Amer >60 >60 mL/min    Comment: (NOTE) The eGFR has been calculated using the CKD EPI equation. This calculation has not been validated in all clinical situations. eGFR's persistently <60 mL/min signify possible Chronic Kidney Disease.    Anion gap 11 5 - 15    Comment: Performed at [facility], [place] [address] [place], [place] [address]  CBC     Status: None   Collection Time: 09/08/17  1:39 PM  Result Value Ref Range   WBC 9.0 4.0 - 10.5 K/uL   RBC 4.50 3.87 - 5.11 MIL/uL   Hemoglobin 13.4 12.0 - 15.0 g/dL   HCT 39.2 36.0 - 46.0 %   MCV 87.1 78.0 - 100.0 fL   MCH 29.8 26.0 - 34.0 pg   MCHC 34.2 30.0 - 36.0 g/dL   RDW 12.8 11.5 - 15.5 %   Platelets 302 150 - 400 K/uL    Comment: Performed at [facility], [place] [address], [place], [place] [address]  D-dimer, quantitative (not at  [facility])     Status: Abnormal   Collection Time: 09/08/17  1:39 PM  Result Value Ref Range   D-Dimer, Quant 4.23 (H) 0.00 - 0.50 ug/mL-FEU    Comment: (NOTE) At the manufacturer cut-off of 0.50 ug/mL FEU, this assay has been documented to exclude PE with a sensitivity and negative predictive value of 97 to 99%.  At this time, this assay has not been approved by the FDA to exclude DVT/VTE. Results should be correlated with clinical presentation. Performed at [facility], [place] [address], [place], [place] [address]   I-stat troponin, ED     Status: None   Collection Time: 09/08/17  1:45 PM  Result Value Ref Range   Troponin i, poc 0.00 0.00 - 0.08 ng/mL   Comment 3            Comment: Due to the release kinetics of cTnI, a negative result within the first hours of the onset of symptoms does not rule out myocardial infarction with certainty. If myocardial infarction is still  suspected, repeat the test at appropriate intervals.   I-Stat beta hCG blood, ED     Status: None   Collection Time: 09/08/17  1:45 PM  Result Value Ref Range   I-stat hCG, quantitative <5.0 <5 mIU/mL   Comment 3            Comment:   GEST. AGE      CONC.  (mIU/mL)   <=1 WEEK        5 - 50     2 WEEKS       50 - 500     3 WEEKS       100 - 10,000     4 WEEKS     1,000 - 30,000        FEMALE AND NON-PREGNANT FEMALE:     LESS THAN 5 mIU/mL    Dg Chest 2 View  Result Date: 09/08/2017 CLINICAL DATA:  27 y/o F; 1 week postop appendectomy. New onset shortness of breath and fatigue. EXAM: CHEST - 2 VIEW COMPARISON:  Is 07/30/2004 chest radiograph. FINDINGS: Stable heart size and mediastinal contours are within normal limits. Both lungs are clear. The visualized skeletal structures are unremarkable. IMPRESSION: No active cardiopulmonary disease. Electronically Signed   By: Kristine Garbe M.D.   On: 09/08/2017 13:57   Ct Angio Chest Pe W And/or Wo Contrast  Result Date: 09/08/2017 CLINICAL DATA:  Appendectomy last Thursday. Episode of shortness of breath today. PE suspected, low pretest probability. EXAM: CT ANGIOGRAPHY CHEST WITH CONTRAST TECHNIQUE: Multidetector CT imaging of the chest was performed using the standard protocol during bolus administration of intravenous contrast. Multiplanar CT image reconstructions and MIPs were obtained to evaluate the vascular anatomy. CONTRAST:  141m ISOVUE-370 IOPAMIDOL (ISOVUE-370) INJECTION 76% COMPARISON:  09/01/2017 FINDINGS: Cardiovascular: Central pulmonary artery filling defects seen within the interlobar pulmonary artery, left lower lobar branch and right lower lobe segmental branches. Clot volume is large and there is associated vessel narrowing. No right heart strain, RV to LV ratio is 0.6-0.7. Normal heart size. No pericardial effusion. Mediastinum/Nodes: Negative for adenopathy. Lungs/Pleura: Negative for infarct or failure. Upper Abdomen:  Negative Musculoskeletal: Negative Critical Value/emergent results were called by telephone at the time of interpretation on 09/08/2017 at 3:29 pm to Dr. YDarl Householder, who verbally acknowledged these results. Review of the MIP images confirms the above findings. IMPRESSION: Positive for bilateral acute lobar and segmental pulmonary emboli. Negative for right heart strain  or infarct. Electronically Signed   By: Monte Fantasia M.D.   On: 09/08/2017 15:30    Pending Labs Unresulted Labs (From admission, onward)   Start     Ordered   09/09/17 0500  CBC  Every 72 hours,   R     09/08/17 1613   Signed and Held  Comprehensive metabolic panel  Tomorrow morning,   R     Signed and Held   Signed and Held  CBC  Tomorrow morning,   R     Signed and Held   Signed and Held  Brain natriuretic peptide  Add-on,   R     Signed and Held      Vitals/Pain Today's Vitals   09/08/17 1308 09/08/17 1309 09/08/17 1310  BP:   136/86  Pulse:   100  Resp:   19  Temp:   98.5 F (36.9 C)  TempSrc:   Oral  SpO2: 98%  98%  Weight:  167 lb (75.8 kg)   Height:  _0  (1.626 m)     Isolation Precautions No active isolations  Medications Medications  iopamidol (ISOVUE-370) 76 % injection (has no administration in time range)  enoxaparin (LOVENOX) injection 75 mg (has no administration in time range)  enoxaparin (LOVENOX) injection 75 mg (has no administration in time range)  iopamidol (ISOVUE-370) 76 % injection 100 mL (100 mLs Intravenous Contrast Given 09/08/17 1508)  sodium chloride 0.9 % bolus 500 mL (0 mLs Intravenous Stopped 09/08/17 1634)  enoxaparin (LOVENOX) injection 75 mg (75 mg Subcutaneous Given 09/08/17 1703)    Mobility walks

## 2017-09-08 NOTE — H&P (Signed)
History and Physical    Marie Schwartz:[id number] DOB: May 07, 1991 DOA: 09/08/2017  PCP: Deatra James, MD  Patient coming from: home  I have personally briefly reviewed patient's old medical records in [facility]  Chief Complaint: SOB  HPI: Marie Schwartz is Marie Schwartz 27 y.o. female with medical history significant of OCD, recent appendectomy for appendicitis presenting with worsening SOB over the past 2 days with CT PE protocol notable for PE.   [name] notes that she had an appendectomy done April 4.  Yesterday she started to notice shortness of breath with exertion.  This progressively worsened until today.  She noted shortness of breath with exertion as well as shortness of breath with speaking as well.  She notes some chest discomfort on the right side which comes and goes.  She denies any fevers, chills, cough, numbness, tingling, weakness.  She notes some nausea that she thinks is due to the oxycodone.  She denies [name] family history of blood clots.  Denies smoking, drinking, or other drugs.  She is on OCPs.  No recent travel.  ED Course: In the ED she had CT PE protocol positive for bilateral acute lobar and segmental PE.  Negative for R heart strain or infarct.  Admit to hospitalist for PE.  Review of Systems: As per HPI otherwise 10 point review of systems negative.   Past Medical History:  Diagnosis Date  . OCD (obsessive compulsive disorder) 09/01/2017    Past Surgical History:  Procedure Laterality Date  . Incision and drainage of peritonsilar abscess  2004   Jori Moll, MD  . LAPAROSCOPIC APPENDECTOMY N/[name] 09/02/2017   Procedure: APPENDECTOMY LAPAROSCOPIC;  Surgeon: Abigail Miyamoto, MD;  Location: [facility];  Service: General;  Laterality: N/[name];  . TONSILLECTOMY    . WISDOM TOOTH EXTRACTION       reports that she has never smoked. She has never used smokeless tobacco. She reports that she does not drink alcohol or use drugs.  No Known Allergies  History reviewed.  No pertinent family history.  Prior to Admission medications   Medication Sig Start Date End Date Taking? Authorizing Provider  acetaminophen (TYLENOL) 325 MG tablet Take 3 tablets (975 mg total) by mouth every 6 (six) hours as needed for mild pain (or temp > 100). 09/03/17  Yes Simaan, Elizabeth S, PA-C  FLUoxetine (PROZAC) 20 MG tablet Take 60 mg by mouth daily.   Yes [provider]  ibuprofen (ADVIL,MOTRIN) 400 MG tablet Take 1-2 tablets (400-800 mg total) by mouth every 6 (six) hours as needed for fever, mild pain or moderate pain. 09/03/17  Yes Simaan, Francine Graven, PA-C  oxyCODONE (OXY IR/ROXICODONE) 5 MG immediate release tablet Take 1 tablet (5 mg total) by mouth every 4 (four) hours as needed for moderate pain, severe pain or breakthrough pain. 09/03/17  Yes Simaan, Francine Graven, PA-C  polyethylene glycol (MIRALAX / GLYCOLAX) packet Take 17 g by mouth daily.   Yes [provider]    Physical Exam: Vitals:   09/08/17 1600 09/08/17 1800 09/08/17 1835 09/08/17 1837  BP: 117/74 116/74 120/76   Pulse: 73 71 70   Resp: (!) 21 (!) 26 20   Temp:   98.4 F (36.9 C)   TempSrc:   Oral   SpO2: 98% 100% 100%   Weight:    74.9 kg (165 lb 3.2 oz)  Height:    5\' 4"  (1.626 m)    Constitutional: NAD, calm, comfortable Vitals:   09/08/17 1600  09/08/17 1800 09/08/17 1835 09/08/17 1837  BP: 117/74 116/74 120/76   Pulse: 73 71 70   Resp: (!) 21 (!) 26 20   Temp:   98.4 F (36.9 C)   TempSrc:   Oral   SpO2: 98% 100% 100%   Weight:    74.9 kg (165 lb 3.2 oz)  Height:    5\' 4"  (1.626 m)   Eyes: PERRL, lids and conjunctivae normal ENMT: Mucous membranes are moist. Posterior pharynx clear of any exudate or lesions.Normal dentition.  Neck: normal, supple, no masses, no thyromegaly Respiratory: clear to auscultation bilaterally, no wheezing, no crackles. Normal respiratory effort. No accessory muscle use.  Cardiovascular: Regular rate and rhythm, no murmurs / rubs / gallops. No  extremity edema. 2+ pedal pulses. No carotid bruits.  Abdomen: no tenderness, no masses palpated. No hepatosplenomegaly. Bowel sounds positive.  Musculoskeletal: no clubbing / cyanosis. No joint deformity upper and lower extremities. Good ROM, no contractures. Normal muscle tone.  Skin: no rashes, lesions, ulcers. No induration Neurologic: CN 2-12 grossly intact. Sensation intact. Strength 5/5 in all 4.  Psychiatric: Normal judgment and insight. Alert and oriented x 3. Normal mood.   Labs on Admission: I have personally reviewed following labs and imaging studies  CBC: Recent Labs  Lab 09/08/17 1339  WBC 9.0  HGB 13.4  HCT 39.2  MCV 87.1  PLT 302   Basic Metabolic Panel: Recent Labs  Lab 09/08/17 1339  NA 138  K 3.9  CL 103  CO2 24  GLUCOSE 114*  BUN 10  CREATININE 0.67  CALCIUM 9.4   GFR: Estimated Creatinine Clearance: 106.6 mL/min (by C-G formula based on SCr of 0.67 mg/dL). Liver Function Tests: No results for input(s): AST, ALT, ALKPHOS, BILITOT, PROT, ALBUMIN in the last 168 hours. No results for input(s): LIPASE, AMYLASE in the last 168 hours. No results for input(s): AMMONIA in the last 168 hours. Coagulation Profile: No results for input(s): INR, PROTIME in the last 168 hours. Cardiac Enzymes: No results for input(s): CKTOTAL, CKMB, CKMBINDEX, TROPONINI in the last 168 hours. BNP (last 3 results) No results for input(s): PROBNP in the last 8760 hours. HbA1C: No results for input(s): HGBA1C in the last 72 hours. CBG: No results for input(s): GLUCAP in the last 168 hours. Lipid Profile: No results for input(s): CHOL, HDL, LDLCALC, TRIG, CHOLHDL, LDLDIRECT in the last 72 hours. Thyroid Function Tests: No results for input(s): TSH, T4TOTAL, FREET4, T3FREE, THYROIDAB in the last 72 hours. Anemia Panel: No results for input(s): VITAMINB12, FOLATE, FERRITIN, TIBC, IRON, RETICCTPCT in the last 72 hours. Urine analysis:    Component Value Date/Time    COLORURINE STRAW ([name]) 09/01/2017 1527   APPEARANCEUR CLEAR 09/01/2017 1527   LABSPEC 1.013 09/01/2017 1527   PHURINE 7.0 09/01/2017 1527   GLUCOSEU NEGATIVE 09/01/2017 1527   HGBUR SMALL (Teo Moede) 09/01/2017 1527   BILIRUBINUR NEGATIVE 09/01/2017 1527   KETONESUR NEGATIVE 09/01/2017 1527   PROTEINUR NEGATIVE 09/01/2017 1527   UROBILINOGEN 1.0 06/18/2008 2201   NITRITE NEGATIVE 09/01/2017 1527   LEUKOCYTESUR NEGATIVE 09/01/2017 1527    Radiological Exams on Admission: Dg Chest 2 View  Result Date: 09/08/2017 CLINICAL DATA:  27 y/o F; 1 week postop appendectomy. New onset shortness of breath and fatigue. EXAM: CHEST - 2 VIEW COMPARISON:  Is 07/30/2004 chest radiograph. FINDINGS: Stable heart size and mediastinal contours are within normal limits. Both lungs are clear. The visualized skeletal structures are unremarkable. IMPRESSION: No active cardiopulmonary disease. Electronically Signed   By:  Mitzi HansenLance  Furusawa-Stratton M.D.   On: 09/08/2017 13:57   Ct Angio Chest Pe W And/or Wo Contrast  Result Date: 09/08/2017 CLINICAL DATA:  Appendectomy last Thursday. Episode of shortness of breath today. PE suspected, low pretest probability. EXAM: CT ANGIOGRAPHY CHEST WITH CONTRAST TECHNIQUE: Multidetector CT imaging of the chest was performed using the standard protocol during bolus administration of intravenous contrast. Multiplanar CT image reconstructions and MIPs were obtained to evaluate the vascular anatomy. CONTRAST:  100mL ISOVUE-370 IOPAMIDOL (ISOVUE-370) INJECTION 76% COMPARISON:  09/01/2017 FINDINGS: Cardiovascular: Central pulmonary artery filling defects seen within the interlobar pulmonary artery, left lower lobar branch and right lower lobe segmental branches. Clot volume is large and there is associated vessel narrowing. No right heart strain, RV to LV ratio is 0.6-0.7. Normal heart size. No pericardial effusion. Mediastinum/Nodes: Negative for adenopathy. Lungs/Pleura: Negative for infarct or  failure. Upper Abdomen: Negative Musculoskeletal: Negative Critical Value/emergent results were called by telephone at the time of interpretation on 09/08/2017 at 3:29 pm to Dr. Silverio LayYao , who verbally acknowledged these results. Review of the MIP images confirms the above findings. IMPRESSION: Positive for bilateral acute lobar and segmental pulmonary emboli. Negative for right heart strain or infarct. Electronically Signed   By: Marnee SpringJonathon  Watts M.D.   On: 09/08/2017 15:30    EKG: Independently reviewed. No priors for comparison.  NSR.  Isolated q in aVL.  T wave inversion in III and aVF.  Possible rae.  Assessment/Plan Active Problems:   PE (pulmonary thromboembolism) (HCC)  Bilateral Acute Lobar and Segmental Pulmonary Emboli  Shortness of breath  Chest discomfort:  Suspect provoked in setting of surgery and OCPs.  Discussed stopping OCD's.  Will need anticoagulation at least 3 months. Simplified pesi is low risk and CT without evidence of R heart strain, but "clot volume is large" Follow up echo and LE dopplers Lovenox (will c/s care management to look into cost of doacs)  Abnormal EKG: likely related to above.  Troponin negative.  Follow repeat EKG in AM, follow troponins.  Birth control: stop OCP, will need to f/u with PCP for alternative  OCD: prozac  DVT prophylaxis: therapeutic lovenox  Code Status: full  Family Communication: stepfather and mother Disposition Plan: inpatient, home pending improvement  Consults called: none  Admission status: inpatient    Lacretia Nicksaldwell Powell MD [facility] Pager [phone]   If 7PM-7AM, please contact night-coverage [email or web address] Password [email or web address]  09/08/2017, 8:10 PM

## 2017-09-08 NOTE — ED Notes (Signed)
Attempted to call report. Receiving RN with another patient and is to cll back for report.

## 2017-09-08 NOTE — Progress Notes (Signed)
Received pt from ED via stretcher.  VSS.  Pt in no apparent distress and no c/o pain or nausea.

## 2017-09-08 NOTE — ED Provider Notes (Signed)
[facility]-EMERGENCY DEPT Provider Note   CSN: [id number] Arrival date & time: 09/08/17  1257     History   Chief Complaint Chief Complaint  Patient presents with  . Shortness of Breath  . Fatigue    HPI Marie Schwartz is a 27 y.o. female.  HPI   Patient is a 27 year old female with a history of OCD and recent appendectomy on 09-01-2017 presenting for shortness of breath and right-sided chest pain.  Patient reports that her shortness of breath began yesterday when she was taking a walk with her mom, and her mom reported that she was only able to get out a few words in a breath without getting winded.  Subsequently, patient reports that she got out of the shower this morning and felt sudden shortness of breath that persisted for 2 hours, and had difficulty holding her arms above her head due to the fatigue and shortness of breath.  Patient reports that she has had an interim increase in the right-sided chest pain she is been experiencing since surgery, which was attributed to gas secondary to anesthesia.  Patient denies any unilateral leg swelling, but does take oral contraceptive pills.  No history of DVT/PE.  No fevers.  Patient has had a nonproductive cough today.  No abdominal pain or surgical site concerns.  No personal history of cardiopulmonary disease.  Past Medical History:  Diagnosis Date  . OCD (obsessive compulsive disorder) 09/01/2017    Patient Active Problem List   Diagnosis Date Noted  . Acute appendicitis 09/01/2017  . OCD (obsessive compulsive disorder) 09/01/2017    Past Surgical History:  Procedure Laterality Date  . Incision and drainage of peritonsilar abscess  2004   Jori Moll, MD  . LAPAROSCOPIC APPENDECTOMY N/A 09/02/2017   Procedure: APPENDECTOMY LAPAROSCOPIC;  Surgeon: Abigail Miyamoto, MD;  Location: [facility] ORS;  Service: General;  Laterality: N/A;  . TONSILLECTOMY    . WISDOM TOOTH EXTRACTION       OB History   None       Home Medications    Prior to Admission medications   Medication Sig Start Date End Date Taking? Authorizing Provider  acetaminophen (TYLENOL) 325 MG tablet Take 3 tablets (975 mg total) by mouth every 6 (six) hours as needed for mild pain (or temp > 100). 09/03/17  Yes Simaan, Elizabeth S, PA-C  FLUoxetine (PROZAC) 20 MG tablet Take 60 mg by mouth daily.   Yes [provider]  ibuprofen (ADVIL,MOTRIN) 400 MG tablet Take 1-2 tablets (400-800 mg total) by mouth every 6 (six) hours as needed for fever, mild pain or moderate pain. 09/03/17  Yes Simaan, Elizabeth S, PA-C  LILLOW 0.15-30 MG-MCG tablet Take 1 tablet by mouth daily. 07/08/17  Yes [provider]  oxyCODONE (OXY IR/ROXICODONE) 5 MG immediate release tablet Take 1 tablet (5 mg total) by mouth every 4 (four) hours as needed for moderate pain, severe pain or breakthrough pain. 09/03/17  Yes Simaan, Francine Graven, PA-C  polyethylene glycol (MIRALAX / GLYCOLAX) packet Take 17 g by mouth daily.   Yes [provider]    Family History No family history on file.  Social History Social History   Tobacco Use  . Smoking status: Never Smoker  . Smokeless tobacco: Never Used  Substance Use Topics  . Alcohol use: Never    Frequency: Never  . Drug use: Never     Allergies   Patient has no known allergies.   Review of Systems Review of  Systems  Constitutional: Negative for chills and fever.  HENT: Negative for congestion, rhinorrhea, sinus pain and sore throat.   Eyes: Negative for visual disturbance.  Respiratory: Positive for cough, chest tightness and shortness of breath. Negative for wheezing.   Cardiovascular: Positive for chest pain. Negative for palpitations and leg swelling.  Gastrointestinal: Negative for abdominal pain, nausea and vomiting.  Genitourinary: Negative for dysuria.  Musculoskeletal: Negative for back pain and myalgias.  Skin: Negative for rash.  Neurological: Positive for  light-headedness. Negative for syncope and headaches.     Physical Exam Updated Vital Signs BP 136/86 (BP Location: Left Arm)   Pulse 100   Temp 98.5 F (36.9 C) (Oral)   Resp 19   Ht 5\' 4"  (1.626 m)   Wt 75.8 kg (167 lb)   LMP 08/30/2017   SpO2 98%   BMI 28.67 kg/m   Physical Exam  Constitutional: She appears well-developed and well-nourished. No distress.  HENT:  Head: Normocephalic and atraumatic.  Mouth/Throat: Oropharynx is clear and moist.  Eyes: Pupils are equal, round, and reactive to light. Conjunctivae and EOM are normal.  Neck: Normal range of motion. Neck supple.  Cardiovascular: Normal rate, regular rhythm, S1 normal and S2 normal.  No murmur heard. Pulmonary/Chest: Effort normal and breath sounds normal. She has no wheezes. She has no rales.  Abdominal: Soft. She exhibits no distension. There is no tenderness.  Well-healing laparoscopy scars of the abdomen.  Musculoskeletal: Normal range of motion. She exhibits no edema or deformity.  Lymphadenopathy:    She has no cervical adenopathy.  Neurological: She is alert.  Cranial nerves grossly intact. Patient moves extremities symmetrically and with good coordination.  Skin: Skin is warm and dry. No rash noted. No erythema.  Psychiatric: She has a normal mood and affect. Her behavior is normal. Judgment and thought content normal.  Nursing note and vitals reviewed.   ED Treatments / Results  Labs (all labs ordered are listed, but only abnormal results are displayed) Labs Reviewed  BASIC METABOLIC PANEL - Abnormal; Notable for the following components:      Result Value   Glucose, Bld 114 (*)    All other components within normal limits  D-DIMER, QUANTITATIVE (NOT AT [facility]) - Abnormal; Notable for the following components:   D-Dimer, Quant 4.23 (*)    All other components within normal limits  CBC  I-STAT TROPONIN, ED  I-STAT BETA HCG BLOOD, ED ([facility], [facility], [facility] ONLY)    EKG EKG  Interpretation  Date/Time:  Wednesday September 08 2017 13:13:19 EDT Ventricular Rate:  91 PR Interval:    QRS Duration: 72 QT Interval:  321 QTC Calculation: 395 R Axis:   87 Text Interpretation:  Sinus rhythm Consider right atrial enlargement Borderline repolarization abnormality No significant change since last tracing Confirmed by Richardean Canal [phone]) on 09/08/2017 3:21:06 PM   Radiology Dg Chest 2 View  Result Date: 09/08/2017 CLINICAL DATA:  27 y/o F; 1 week postop appendectomy. New onset shortness of breath and fatigue. EXAM: CHEST - 2 VIEW COMPARISON:  Is 07/30/2004 chest radiograph. FINDINGS: Stable heart size and mediastinal contours are within normal limits. Both lungs are clear. The visualized skeletal structures are unremarkable. IMPRESSION: No active cardiopulmonary disease. Electronically Signed   By: Mitzi Hansen M.D.   On: 09/08/2017 13:57   Ct Angio Chest Pe W And/or Wo Contrast  Result Date: 09/08/2017 CLINICAL DATA:  Appendectomy last Thursday. Episode of shortness of breath today. PE suspected, low pretest probability. EXAM:  CT ANGIOGRAPHY CHEST WITH CONTRAST TECHNIQUE: Multidetector CT imaging of the chest was performed using the standard protocol during bolus administration of intravenous contrast. Multiplanar CT image reconstructions and MIPs were obtained to evaluate the vascular anatomy. CONTRAST:  100mL ISOVUE-370 IOPAMIDOL (ISOVUE-370) INJECTION 76% COMPARISON:  09/01/2017 FINDINGS: Cardiovascular: Central pulmonary artery filling defects seen within the interlobar pulmonary artery, left lower lobar branch and right lower lobe segmental branches. Clot volume is large and there is associated vessel narrowing. No right heart strain, RV to LV ratio is 0.6-0.7. Normal heart size. No pericardial effusion. Mediastinum/Nodes: Negative for adenopathy. Lungs/Pleura: Negative for infarct or failure. Upper Abdomen: Negative Musculoskeletal: Negative Critical Value/emergent  results were called by telephone at the time of interpretation on 09/08/2017 at 3:29 pm to Dr. Silverio LayYao , who verbally acknowledged these results. Review of the MIP images confirms the above findings. IMPRESSION: Positive for bilateral acute lobar and segmental pulmonary emboli. Negative for right heart strain or infarct. Electronically Signed   By: Marnee SpringJonathon  Watts M.D.   On: 09/08/2017 15:30    Procedures Procedures (including critical care time)  Medications Ordered in ED Medications  iopamidol (ISOVUE-370) 76 % injection (has no administration in time range)  sodium chloride 0.9 % bolus 500 mL (500 mLs Intravenous New Bag/Given 09/08/17 1525)  iopamidol (ISOVUE-370) 76 % injection 100 mL (100 mLs Intravenous Contrast Given 09/08/17 1508)     Initial Impression / Assessment and Plan / ED Course  I have reviewed the triage vital signs and the nursing notes.  Pertinent labs & imaging results that were available during my care of the patient were reviewed by me and considered in my medical decision making (see chart for details).  Clinical Course as of Sep 08 1536  Wed Sep 08, 2017  1538 Spoke with Dr. Lowell GuitarPowell of [facility], who will admit the patient.   [AM]    Clinical Course User Index [AM] Elisha PonderMurray, Alyssa B, PA-C    Patient is nontoxic-appearing, borderline tachycardic at 100, and in no acute distress at rest.  No hypoxia exhibited.  Patient reports shortness of breath with any kind of exertion.  Differential diagnosis is post surgical pneumonia versus pulmonary embolism.  Lab work unremarkable with no evidence of anemia, elevated troponin, d-dimer elevated to 4.23.  CTPA demonstrating bilateral pulmonary emboli.  No evidence of right heart strain with a large clot burden on CTPA.  Heparin initiated.  Patient to be admitted to [facility].  This is a shared visit with Dr. Chaney Mallingavid Yao. Patient was independently evaluated by this attending physician. Attending physician  consulted in evaluation and admission management.   Final Clinical Impressions(s) / ED Diagnoses   Final diagnoses:  Bilateral pulmonary embolism [facility](HCC)    ED Discharge Orders    None       Delia ChimesMurray, Alyssa B, PA-C 09/08/17 1541    Charlynne PanderYao, David Hsienta, MD 09/09/17 [phone]

## 2017-09-09 ENCOUNTER — Inpatient Hospital Stay (HOSPITAL_COMMUNITY): Payer: BLUE CROSS/BLUE SHIELD

## 2017-09-09 DIAGNOSIS — I2699 Other pulmonary embolism without acute cor pulmonale: Principal | ICD-10-CM

## 2017-09-09 DIAGNOSIS — R0602 Shortness of breath: Secondary | ICD-10-CM

## 2017-09-09 LAB — COMPREHENSIVE METABOLIC PANEL
ALBUMIN: 3.2 g/dL — AB (ref 3.5–5.0)
ALT: 19 U/L (ref 14–54)
ANION GAP: 9 (ref 5–15)
AST: 13 U/L — ABNORMAL LOW (ref 15–41)
Alkaline Phosphatase: 57 U/L (ref 38–126)
BILIRUBIN TOTAL: 0.4 mg/dL (ref 0.3–1.2)
BUN: 11 mg/dL (ref 6–20)
CALCIUM: 8.6 mg/dL — AB (ref 8.9–10.3)
CO2: 24 mmol/L (ref 22–32)
Chloride: 104 mmol/L (ref 101–111)
Creatinine, Ser: 0.67 mg/dL (ref 0.44–1.00)
GFR calc Af Amer: >60 mL/min (ref >60)
GFR calc non Af Amer: >60 mL/min (ref >60)
GLUCOSE: 115 mg/dL — AB (ref 65–99)
Potassium: 3.7 mmol/L (ref 3.5–5.1)
Sodium: 137 mmol/L (ref 135–145)
TOTAL PROTEIN: 6.4 g/dL — AB (ref 6.5–8.1)

## 2017-09-09 LAB — CBC
HEMATOCRIT: 36.4 % (ref 36.0–46.0)
HEMOGLOBIN: 11.9 g/dL — AB (ref 12.0–15.0)
MCH: 28.7 pg (ref 26.0–34.0)
MCHC: 32.7 g/dL (ref 30.0–36.0)
MCV: 87.7 fL (ref 78.0–100.0)
Platelets: 273 10*3/uL (ref 150–400)
RBC: 4.15 MIL/uL (ref 3.87–5.11)
RDW: 13 % (ref 11.5–15.5)
WBC: 8.1 10*3/uL (ref 4.0–10.5)

## 2017-09-09 LAB — ECHOCARDIOGRAM COMPLETE
Height: 64 in
Weight: 2611.2 oz

## 2017-09-09 LAB — TROPONIN I: Troponin I: <0.03 ng/mL (ref <0.03)

## 2017-09-09 NOTE — Progress Notes (Signed)
*  PRELIMINARY RESULTS* Echocardiogram 2D Echocardiogram has been performed.  Jeryl Columbialliott, Izeyah Deike 09/09/2017, 2:27 PM

## 2017-09-09 NOTE — Progress Notes (Signed)
Bilateral lower extremity venous duplex completed. Right - No evidence of a DVT, superficial thrombosis, or Baker's cyst. Left - Positive for a small acute DVT in the proximal popliteal vein in the popliteal fossa. There is no evidence of a superficial thrombosis or Baker's cyst. Toma DeitersVirginia Ki Luckman, RVS 09/09/2017 9:37 AM

## 2017-09-09 NOTE — Plan of Care (Signed)
  Problem: Clinical Measurements: Goal: Respiratory complications will improve Outcome: Progressing Goal: Cardiovascular complication will be avoided Outcome: Progressing   Problem: Activity: Goal: Risk for activity intolerance will decrease Outcome: Progressing   Problem: Nutrition: Goal: Adequate nutrition will be maintained Outcome: Progressing   Problem: Coping: Goal: Level of anxiety will decrease Outcome: Progressing   Problem: Elimination: Goal: Will not experience complications related to bowel motility Outcome: Progressing Goal: Will not experience complications related to urinary retention Outcome: Progressing   Problem: Pain Managment: Goal: General experience of comfort will improve Outcome: Progressing   Problem: Safety: Goal: Ability to remain free from injury will improve Outcome: Progressing   Problem: Skin Integrity: Goal: Risk for impaired skin integrity will decrease Outcome: Progressing   

## 2017-09-09 NOTE — Progress Notes (Signed)
[facility] PROGRESS NOTE    Progress Note  Marie Schwartz  ZOX:[id number] DOB: 01-03-1991 DOA: 09/08/2017 PCP: Deatra James, MD     Brief Narrative:   Marie Schwartz is an 27 y.o. female on oral contraceptive pills with a recent appendectomy presents with worsening shortness of breath CT Angie of the chest showed PE  Assessment/Plan:   Principal Problem:   PE (pulmonary thromboembolism) (HCC) Active Problems:   OCD (obsessive compulsive disorder) We suggest stopping oral contraceptive pills, she will need oral intake regulation for at least 3 months. CT angios showed no right heart strain. We will have to transition her to NOAC's Doppler and 2D echo are pending. Cont Lovenox    DVT prophylaxis: lovenox Family Communication:mother and father Disposition Plan/Barrier to D/C: home in am Code Status:     Code Status Orders  (From admission, onward)        Start     Ordered   09/08/17 1831  Full code  Continuous     09/08/17 1830    Code Status History    Date Active Date Inactive Code Status Order ID Comments User Context   09/01/2017 1747 09/03/2017 1320 Full Code [id number]  Karie Soda, MD ED        IV Access:    Peripheral IV   Procedures and diagnostic studies:   Dg Chest 2 View  Result Date: 09/08/2017 CLINICAL DATA:  27 y/o F; 1 week postop appendectomy. New onset shortness of breath and fatigue. EXAM: CHEST - 2 VIEW COMPARISON:  Is 07/30/2004 chest radiograph. FINDINGS: Stable heart size and mediastinal contours are within normal limits. Both lungs are clear. The visualized skeletal structures are unremarkable. IMPRESSION: No active cardiopulmonary disease. Electronically Signed   By: Mitzi Hansen M.D.   On: 09/08/2017 13:57   Ct Angio Chest Pe W And/or Wo Contrast  Result Date: 09/08/2017 CLINICAL DATA:  Appendectomy last Thursday. Episode of shortness of breath today. PE suspected, low pretest probability. EXAM: CT ANGIOGRAPHY  CHEST WITH CONTRAST TECHNIQUE: Multidetector CT imaging of the chest was performed using the standard protocol during bolus administration of intravenous contrast. Multiplanar CT image reconstructions and MIPs were obtained to evaluate the vascular anatomy. CONTRAST:  ISOVUE-370 IOPAMIDOL (ISOVUE-370) INJECTION 76% COMPARISON:  09/01/2017 FINDINGS: Cardiovascular: Central pulmonary artery filling defects seen within the interlobar pulmonary artery, left lower lobar branch and right lower lobe segmental branches. Clot volume is large and there is associated vessel narrowing. No right heart strain, RV to LV ratio is 0.6-0.7. Normal heart size. No pericardial effusion. Mediastinum/Nodes: Negative for adenopathy. Lungs/Pleura: Negative for infarct or failure. Upper Abdomen: Negative Musculoskeletal: Negative Critical Value/emergent results were called by telephone at the time of interpretation on 09/08/2017 at 3:29 pm to Dr. Silverio Lay , who verbally acknowledged these results. Review of the MIP images confirms the above findings. IMPRESSION: Positive for bilateral acute lobar and segmental pulmonary emboli. Negative for right heart strain or infarct. Electronically Signed   By: Marnee Spring M.D.   On: 09/08/2017 15:30     Medical Consultants:    None.  Anti-Infectives:   none  Subjective:    Marie Schwartz feels great no new complaints. Objective:    Vitals:   09/08/17 1835 09/08/17 1837 09/08/17 2217 09/09/17 0518  BP: 120/76  119/69 124/68  Pulse: 70  71 60  Resp: 20  20 18   Temp: 98.4 F (36.9 C)  98 F (36.7 C) 98.2 F (36.8 C)  TempSrc: Oral  Oral Oral  SpO2: 100%  99% 99%  Weight:  74.9 kg (165 lb 3.2 oz)  74 kg (163 lb 3.2 oz)  Height:  5\' 4"  (1.626 m)      Intake/Output Summary (Last 24 hours) at 09/09/2017 0809 Last data filed at 09/09/2017 5784 Gross per 24 hour  Intake 951 ml  Output -  Net 951 ml   Filed Weights   09/08/17 1309 09/08/17 1837 09/09/17 0518    Weight: 75.8 kg (167 lb) 74.9 kg (165 lb 3.2 oz) 74 kg (163 lb 3.2 oz)    Exam: General exam: In no acute distress. Respiratory system: Good air movement and clear to auscultation. Cardiovascular system: S1 & S2 heard, RRR. No JVD, murmurs, rubs, gallops or clicks.  Gastrointestinal system: Abdomen is nondistended, soft and nontender.  Central nervous system: Alert and oriented. No focal neurological deficits. Extremities: No pedal edema. Skin: No rashes, lesions or ulcers Psychiatry: Judgement and insight appear normal. Mood & affect appropriate.    Data Reviewed:    Labs: Basic Metabolic Panel: Recent Labs  Lab 09/08/17 1339 09/09/17 0307  NA 138 137  K 3.9 3.7  CL 103 104  CO2 24 24  GLUCOSE 114* 115*  BUN 10 11  CREATININE 0.67 0.67  CALCIUM 9.4 8.6*   GFR Estimated Creatinine Clearance: 105.9 mL/min (by C-G formula based on SCr of 0.67 mg/dL). Liver Function Tests: Recent Labs  Lab 09/09/17 0307  AST 13*  ALT 19  ALKPHOS 57  BILITOT 0.4  PROT 6.4*  ALBUMIN 3.2*   No results for input(s): LIPASE, AMYLASE in the last 168 hours. No results for input(s): AMMONIA in the last 168 hours. Coagulation profile No results for input(s): INR, PROTIME in the last 168 hours.  CBC: Recent Labs  Lab 09/08/17 1339 09/09/17 0307  WBC 9.0 8.1  HGB 13.4 11.9*  HCT 39.2 36.4  MCV 87.1 87.7  PLT 302 273   Cardiac Enzymes: Recent Labs  Lab 09/08/17 2034 09/09/17 0307  TROPONINI <0.03 <0.03   BNP (last 3 results) No results for input(s): PROBNP in the last 8760 hours. CBG: No results for input(s): GLUCAP in the last 168 hours. D-Dimer: Recent Labs    09/08/17 1339  DDIMER 4.23*   Hgb A1c: No results for input(s): HGBA1C in the last 72 hours. Lipid Profile: No results for input(s): CHOL, HDL, LDLCALC, TRIG, CHOLHDL, LDLDIRECT in the last 72 hours. Thyroid function studies: No results for input(s): TSH, T4TOTAL, T3FREE, THYROIDAB in the last 72  hours.  Invalid input(s): FREET3 Anemia work up: No results for input(s): VITAMINB12, FOLATE, FERRITIN, TIBC, IRON, RETICCTPCT in the last 72 hours. Sepsis Labs: Recent Labs  Lab 09/08/17 1339 09/09/17 0307  WBC 9.0 8.1   Microbiology Recent Results (from the past 240 hour(s))  Surgical PCR screen     Status: Abnormal   Collection Time: 09/02/17  2:53 AM  Result Value Ref Range Status   MRSA, PCR NEGATIVE NEGATIVE Final   Staphylococcus aureus POSITIVE (A) NEGATIVE Final    Comment: (NOTE) The Xpert SA Assay (FDA approved for NASAL specimens in patients 51 years of age and older), is one component of a comprehensive surveillance program. It is not intended to diagnose infection nor to guide or monitor treatment. Performed at [facility], [address], [place], [place] [address]      Medications:   . enoxaparin (LOVENOX) injection  75 mg Subcutaneous Q12H  . FLUoxetine  60 mg Oral Daily  Continuous Infusions:    LOS: 1 day   Marinda Elkbraham Feliz Ortiz  [facility] Pager [phone]  *Please refer to [email or web address], password [email or web address] to get updated schedule on who will round on this patient, as hospitalists switch teams weekly. If 7PM-7AM, please contact night-coverage at [email or web address], password [email or web address] for any overnight needs.  09/09/2017, 8:09 AM

## 2017-09-09 NOTE — Progress Notes (Signed)
CC:  SOB/fatigue after Laparoscopic appendectomy 09/02/17  Subjective: She still has some chest discomfort, but she thinks she can breath deeper this AM.  Her port sites from her appendix all look good.    Objective: Vital signs in last 24 hours: Temp:  [98 F (36.7 C)-98.5 F (36.9 C)] 98.2 F (36.8 C) (04/11 0518) Pulse Rate:  [60-100] 60 (04/11 0518) Resp:  [18-26] 18 (04/11 0518) BP: (116-136)/(68-86) 124/68 (04/11 0518) SpO2:  [98 %-100 %] 99 % (04/11 0518) Weight:  [74 kg (163 lb 3.2 oz)-75.8 kg (167 lb)] 74 kg (163 lb 3.2 oz) (04/11 0518) Last BM Date: 09/08/17 951 PO Voided x 3 Afebrile, VSS Labs OK CT chest yesterday:  Positive for bilateral acute lobar and segmental pulmonary emboli.  Negative for right heart strain or infarct. Intake/Output from previous day: 04/10 0701 - 04/11 0700 In: 951 [P.O.:951] Out: -  Intake/Output this shift: No intake/output data recorded.  General appearance: alert, cooperative and no distress GI: soft, sites all look good.  Tolerating diet and doing well from appendectomy   Lab Results:  Recent Labs    09/08/17 1339 09/09/17 0307  WBC 9.0 8.1  HGB 13.4 11.9*  HCT 39.2 36.4  PLT 302 273    BMET Recent Labs    09/08/17 1339 09/09/17 0307  NA 138 137  K 3.9 3.7  CL 103 104  CO2 24 24  GLUCOSE 114* 115*  BUN 10 11  CREATININE 0.67 0.67  CALCIUM 9.4 8.6*   PT/INR No results for input(s): LABPROT, INR in the last 72 hours.  Recent Labs  Lab 09/09/17 0307  AST 13*  ALT 19  ALKPHOS 57  BILITOT 0.4  PROT 6.4*  ALBUMIN 3.2*     Lipase     Component Value Date/Time   LIPASE 34 09/01/2017 1527     Prior to Admission medications   Medication Sig Start Date End Date Taking? Authorizing Provider  acetaminophen (TYLENOL) 325 MG tablet Take 3 tablets (975 mg total) by mouth every 6 (six) hours as needed for mild pain (or temp > 100). 09/03/17  Yes Simaan, Elizabeth S, PA-C  FLUoxetine (PROZAC) 20 MG tablet Take  60 mg by mouth daily.   Yes [provider]  ibuprofen (ADVIL,MOTRIN) 400 MG tablet Take 1-2 tablets (400-800 mg total) by mouth every 6 (six) hours as needed for fever, mild pain or moderate pain. 09/03/17  Yes Simaan, Francine Graven, PA-C  oxyCODONE (OXY IR/ROXICODONE) 5 MG immediate release tablet Take 1 tablet (5 mg total) by mouth every 4 (four) hours as needed for moderate pain, severe pain or breakthrough pain. 09/03/17  Yes Simaan, Francine Graven, PA-C  polyethylene glycol (MIRALAX / GLYCOLAX) packet Take 17 g by mouth daily.   Yes [provider]  LILLOW 0.15-30 MG-MCG tablet  Home med Generic drug:  levonorgestrel-ethinyl estradiol Take 1 tablet by mouth daily.   Medications: . enoxaparin (LOVENOX) injection  75 mg Subcutaneous Q12H  . FLUoxetine  60 mg Oral Daily   Anti-infectives (From admission, onward)   None     Assessment/Plan OCD On Birth control   Bilateral Acute Lobar and Segmental Pulmonary Emboli  Shortness of breath  Chest discomfort S/p laparoscopic appendectomy 09/02/17, Dr. Magnus Ivan  FEN:  Regular diet ID:  None DVT:  Lovenox Follow up:  [facility]   Plan:  Nothing to add.  She is doing well post appendectomy from her surgery.  She can follow up with our office  as scheduled.      LOS: 1 day    Marie Schwartz 09/09/2017 [phone]

## 2017-09-09 NOTE — Care Management Note (Signed)
Case Management Note  Patient Details  Name: Marie BridgemanJennifer A Schwartz MRN: [id number] Date of Birth: 08/06/1990  Subjective/Objective: Benefit checked for xarelto/eliquis-xarelto$42/eliquis cost $400-1,247.00-will inform patient.                   Action/Plan:d/c home.   Expected Discharge Date:  (unknown)               Expected Discharge Plan:  Home/Self Care  In-House Referral:     Discharge planning Services  CM Consult  Post Acute Care Choice:    Choice offered to:     DME Arranged:    DME Agency:     HH Arranged:    HH Agency:     Status of Service:  Completed, signed off  If discussed at [employer] Length [employer] Stay Meetings, dates discussed:    Additional Comments:  Lanier ClamMahabir, Matthan Sledge, RN 09/09/2017, 2:43 PM

## 2017-09-09 NOTE — Progress Notes (Signed)
Patient's mother asking about Echo procedure. Called ECHO personnel, and they stated they would see the patient in the next 30 minutes or so. Followed up with patient and family about test. All other questions answered.

## 2017-09-09 NOTE — Progress Notes (Signed)
Provided patient w/xarelto coupon 0 co pay-patient voiced understanding.

## 2017-09-10 MED ORDER — RIVAROXABAN 20 MG PO TABS: 20.0000 mg | ORAL_TABLET | Freq: Every day | ORAL | 2 refills | Status: DC

## 2017-09-10 MED ORDER — RIVAROXABAN 15 MG PO TABS: 15.0000 mg | ORAL_TABLET | Freq: Two times a day (BID) | ORAL | Status: DC

## 2017-09-10 MED ORDER — RIVAROXABAN (XARELTO) VTE STARTER PACK (15 & 20 MG): ORAL_TABLET | ORAL | 0 refills | Status: DC

## 2017-09-10 MED ORDER — RIVAROXABAN 20 MG PO TABS: 20.0000 mg | ORAL_TABLET | Freq: Two times a day (BID) | ORAL | Status: DC

## 2017-09-10 NOTE — Discharge Instructions (Signed)
Information on my medicine - XARELTO (rivaroxaban)  This medication education was reviewed with me or my healthcare representative as part of my discharge preparation.  The pharmacist that spoke with me during my hospital stay was:  Elson ClanLilliston, Kylinn Shropshire Michelle, [facility]  WHY WAS Carlena HurlXARELTO PRESCRIBED FOR YOU? Xarelto was prescribed to treat blood clots that may have been found in the veins of your legs (deep vein thrombosis) or in your lungs (pulmonary embolism) and to reduce the risk of them occurring again.  What do you need to know about Xarelto? The starting dose is one 15 mg tablet taken TWICE daily with food for the FIRST 21 DAYS then on May 2nd the dose is changed to one 20 mg tablet taken ONCE A DAY with your evening meal.  DO NOT stop taking Xarelto without talking to the health care provider who prescribed the medication.  Refill your prescription for 20 mg tablets before you run out.  After discharge, you should have regular check-up appointments with your healthcare provider that is prescribing your Xarelto.  In the future your dose may need to be changed if your kidney function changes by a significant amount.  What do you do if you miss a dose? If you are taking Xarelto TWICE DAILY and you miss a dose, take it as soon as you remember. You may take two 15 mg tablets (total 30 mg) at the same time then resume your regularly scheduled 15 mg twice daily the next day.  If you are taking Xarelto ONCE DAILY and you miss a dose, take it as soon as you remember on the same day then continue your regularly scheduled once daily regimen the next day. Do not take two doses of Xarelto at the same time.   Important Safety Information Xarelto is a blood thinner medicine that can cause bleeding. You should call your healthcare provider right away if you experience any of the following: ? Bleeding from an injury or your nose that does not stop. ? Unusual colored urine (red or dark brown) or  unusual colored stools (red or black). ? Unusual bruising for unknown reasons. ? A serious fall or if you hit your head (even if there is no bleeding).  Some medicines may interact with Xarelto and might increase your risk of bleeding while on Xarelto. To help avoid this, consult your healthcare provider or pharmacist prior to using any new prescription or non-prescription medications, including herbals, vitamins, non-steroidal anti-inflammatory drugs (NSAIDs) and supplements.  This website has more information on Xarelto: [email or web address].

## 2017-09-10 NOTE — Discharge Summary (Signed)
Physician Discharge Summary  Marie BridgemanJennifer A Schwartz WUJ:[id number] DOB: 06-01-1991 DOA: 09/08/2017  PCP: Marie JamesSun, Vyvyan, MD  Admit date: 09/08/2017 Discharge date: 09/10/2017  Admitted From: home Disposition:  Home  Recommendations for Outpatient Follow-up:  1. Follow up with PCP in 1-2 weeks 2. Please obtain BMP/CBC in one week   Home Health:none Equipment/Devices:none  Discharge Condition:stable CODE STATUS:full Diet recommendation: Heart Healthy  Brief/Interim Summary: 27 y.o. female on oral contraceptive pills with a recent appendectomy presents with worsening shortness of breath CT Angie of the chest showed PE    Discharge Diagnoses:  Principal Problem:   PE (pulmonary thromboembolism) (HCC) Active Problems:   OCD (obsessive compulsive disorder) CT Angie of the chest was positive for PE no right heart strain, 2D echo showed no heart strain. She was started on anticoagulation, the most likely cause of her acute PE is oral contraceptive pills in the setting of immobility. She was monitored overnight her heart rate and respiration remained stable. She was changed to Xarelto which she will continue as an outpatient for 3 months follow-up with PCP in 2-3 months.  Discharge Instructions  Discharge Instructions    Diet - low sodium heart healthy   Complete by:  As directed    Increase activity slowly   Complete by:  As directed      Allergies as of 09/10/2017   No Known Allergies     Medication List    STOP taking these medications   ibuprofen 400 MG tablet Commonly known as:  ADVIL,MOTRIN   oxyCODONE 5 MG immediate release tablet Commonly known as:  Oxy IR/ROXICODONE     TAKE these medications   acetaminophen 325 MG tablet Commonly known as:  TYLENOL Take 3 tablets (975 mg total) by mouth every 6 (six) hours as needed for mild pain (or temp > 100).   FLUoxetine 20 MG tablet Commonly known as:  PROZAC Take 60 mg by mouth daily.   polyethylene glycol  packet Commonly known as:  MIRALAX / GLYCOLAX Take 17 g by mouth daily.   Rivaroxaban 15 & 20 MG Tbpk Take as directed on package: Start with one 15mg  tablet by mouth twice a day with food. On Day 22, switch to one 20mg  tablet once a day with food.   rivaroxaban 20 MG Tabs tablet Commonly known as:  XARELTO Take 1 tablet (20 mg total) by mouth daily with supper. Start taking on:  09/30/2017       No Known Allergies  Consultations:  None   Procedures/Studies: Dg Chest 2 View  Result Date: 09/08/2017 CLINICAL DATA:  27 y/o F; 1 week postop appendectomy. New onset shortness of breath and fatigue. EXAM: CHEST - 2 VIEW COMPARISON:  Is 07/30/2004 chest radiograph. FINDINGS: Stable heart size and mediastinal contours are within normal limits. Both lungs are clear. The visualized skeletal structures are unremarkable. IMPRESSION: No active cardiopulmonary disease. Electronically Signed   By: Mitzi HansenLance  Furusawa-Stratton M.D.   On: 09/08/2017 13:57   Ct Angio Chest Pe W And/or Wo Contrast  Result Date: 09/08/2017 CLINICAL DATA:  Appendectomy last Thursday. Episode of shortness of breath today. PE suspected, low pretest probability. EXAM: CT ANGIOGRAPHY CHEST WITH CONTRAST TECHNIQUE: Multidetector CT imaging of the chest was performed using the standard protocol during bolus administration of intravenous contrast. Multiplanar CT image reconstructions and MIPs were obtained to evaluate the vascular anatomy. CONTRAST:  100mL ISOVUE-370 IOPAMIDOL (ISOVUE-370) INJECTION 76% COMPARISON:  09/01/2017 FINDINGS: Cardiovascular: Central pulmonary artery filling defects seen within the interlobar pulmonary  artery, left lower lobar branch and right lower lobe segmental branches. Clot volume is large and there is associated vessel narrowing. No right heart strain, RV to LV ratio is 0.6-0.7. Normal heart size. No pericardial effusion. Mediastinum/Nodes: Negative for adenopathy. Lungs/Pleura: Negative for infarct or  failure. Upper Abdomen: Negative Musculoskeletal: Negative Critical Value/emergent results were called by telephone at the time of interpretation on 09/08/2017 at 3:29 pm to Dr. Silverio Lay , who verbally acknowledged these results. Review of the MIP images confirms the above findings. IMPRESSION: Positive for bilateral acute lobar and segmental pulmonary emboli. Negative for right heart strain or infarct. Electronically Signed   By: Marnee Spring M.D.   On: 09/08/2017 15:30   Ct Abdomen Pelvis W Contrast  Result Date: 09/01/2017 CLINICAL DATA:  RIGHT lower quadrant pain for 3 days. Assess for appendicitis versus ovarian torsion. EXAM: CT ABDOMEN AND PELVIS WITH CONTRAST TECHNIQUE: Multidetector CT imaging of the abdomen and pelvis was performed using the standard protocol following bolus administration of intravenous contrast. CONTRAST:  ISOVUE-300 IOPAMIDOL (ISOVUE-300) INJECTION 61% COMPARISON:  CT abdomen and pelvis February 12, 2010 FINDINGS: LOWER CHEST: Lung bases are clear. Included heart size is normal. No pericardial effusion. HEPATOBILIARY: Liver and gallbladder are normal. PANCREAS: Normal. SPLEEN: Normal. ADRENALS/URINARY TRACT: Kidneys are orthotopic, demonstrating symmetric enhancement. No nephrolithiasis, hydronephrosis or solid renal masses. The unopacified ureters are normal in course and caliber. Urinary bladder is partially distended and unremarkable. Normal adrenal glands. STOMACH/BOWEL: The stomach, small and large bowel are normal in course and caliber without inflammatory changes. Appendix: Location: Retrocecal Diameter: 7 mm, previously 3 mm Appendicolith: None Mucosal hyper-enhancement: Slight mural enhancement and periappendiceal inflammatory changes Extraluminal gas: None Periappendiceal collection: None VASCULAR/LYMPHATIC: Aortoiliac vessels are normal in course and caliber. No lymphadenopathy by CT size criteria. REPRODUCTIVE: Normal. OTHER: No intraperitoneal free fluid or free air.  MUSCULOSKELETAL: Nonacute. Very small fat containing umbilical hernia. IMPRESSION: Early acute appendicitis. These results will be called to the ordering clinician or representative by the Radiologist Assistant, and communication documented in the PACS or zVision Dashboard. Electronically Signed   By: Awilda Metro M.D.   On: 09/01/2017 14:17     Subjective: No complaints feels great.  Discharge Exam: Vitals:   09/09/17 2036 09/10/17 0444  BP: (!) 110/95 124/83  Pulse: 74 (!) 59  Resp: 20 20  Temp: 98.8 F (37.1 C) 98.6 F (37 C)  SpO2: 99% 99%   Vitals:   09/09/17 0518 09/09/17 1247 09/09/17 2036 09/10/17 0444  BP: 124/68 133/81 (!) 110/95 124/83  Pulse: 60 74 74 (!) 59  Resp: 18 16 20 20   Temp: 98.2 F (36.8 C) 98.7 F (37.1 C) 98.8 F (37.1 C) 98.6 F (37 C)  TempSrc: Oral Oral Oral Oral  SpO2: 99% 100% 99% 99%  Weight: 74 kg (163 lb 3.2 oz)   73.3 kg (161 lb 9.6 oz)  Height:        General: Pt is alert, awake, not in acute distress Cardiovascular: RRR, S1/S2 +, no rubs, no gallops Respiratory: CTA bilaterally, no wheezing, no rhonchi Abdominal: Soft, NT, ND, bowel sounds + Extremities: no edema, no cyanosis    The results of significant diagnostics from this hospitalization (including imaging, microbiology, ancillary and laboratory) are listed below for reference.     Microbiology: Recent Results (from the past 240 hour(s))  Surgical PCR screen     Status: Abnormal   Collection Time: 09/02/17  2:53 AM  Result Value Ref Range Status   MRSA,  PCR NEGATIVE NEGATIVE Final   Staphylococcus aureus POSITIVE (A) NEGATIVE Final    Comment: (NOTE) The Xpert SA Assay (FDA approved for NASAL specimens in patients 17 years of age and older), is one component of a comprehensive surveillance program. It is not intended to diagnose infection nor to guide or monitor treatment. Performed at [facility], [address], [place], [place] [address]       Labs: BNP (last 3 results) Recent Labs    09/08/17 1339  BNP 13.7   Basic Metabolic Panel: Recent Labs  Lab 09/08/17 1339 09/09/17 0307  NA 138 137  K 3.9 3.7  CL 103 104  CO2 24 24  GLUCOSE 114* 115*  BUN 10 11  CREATININE 0.67 0.67  CALCIUM 9.4 8.6*   Liver Function Tests: Recent Labs  Lab 09/09/17 0307  AST 13*  ALT 19  ALKPHOS 57  BILITOT 0.4  PROT 6.4*  ALBUMIN 3.2*   No results for input(s): LIPASE, AMYLASE in the last 168 hours. No results for input(s): AMMONIA in the last 168 hours. CBC: Recent Labs  Lab 09/08/17 1339 09/09/17 0307  WBC 9.0 8.1  HGB 13.4 11.9*  HCT 39.2 36.4  MCV 87.1 87.7  PLT 302 273   Cardiac Enzymes: Recent Labs  Lab 09/08/17 2034 09/09/17 0307  TROPONINI <0.03 <0.03   BNP: Invalid input(s): POCBNP CBG: No results for input(s): GLUCAP in the last 168 hours. D-Dimer Recent Labs    09/08/17 1339  DDIMER 4.23*   Hgb A1c No results for input(s): HGBA1C in the last 72 hours. Lipid Profile No results for input(s): CHOL, HDL, LDLCALC, TRIG, CHOLHDL, LDLDIRECT in the last 72 hours. Thyroid function studies No results for input(s): TSH, T4TOTAL, T3FREE, THYROIDAB in the last 72 hours.  Invalid input(s): FREET3 Anemia work up No results for input(s): VITAMINB12, FOLATE, FERRITIN, TIBC, IRON, RETICCTPCT in the last 72 hours. Urinalysis    Component Value Date/Time   COLORURINE STRAW (A) 09/01/2017 1527   APPEARANCEUR CLEAR 09/01/2017 1527   LABSPEC 1.013 09/01/2017 1527   PHURINE 7.0 09/01/2017 1527   GLUCOSEU NEGATIVE 09/01/2017 1527   HGBUR SMALL (A) 09/01/2017 1527   BILIRUBINUR NEGATIVE 09/01/2017 1527   KETONESUR NEGATIVE 09/01/2017 1527   PROTEINUR NEGATIVE 09/01/2017 1527   UROBILINOGEN 1.0 06/18/2008 2201   NITRITE NEGATIVE 09/01/2017 1527   LEUKOCYTESUR NEGATIVE 09/01/2017 1527   Sepsis Labs Invalid input(s): PROCALCITONIN,  WBC,  LACTICIDVEN Microbiology Recent Results (from the past 240  hour(s))  Surgical PCR screen     Status: Abnormal   Collection Time: 09/02/17  2:53 AM  Result Value Ref Range Status   MRSA, PCR NEGATIVE NEGATIVE Final   Staphylococcus aureus POSITIVE (A) NEGATIVE Final    Comment: (NOTE) The Xpert SA Assay (FDA approved for NASAL specimens in patients 39 years of age and older), is one component of a comprehensive surveillance program. It is not intended to diagnose infection nor to guide or monitor treatment. Performed at [facility], [address], [place], [place] [address]      Time coordinating discharge: 35 minutes  SIGNED:   Marinda Elk, MD  [facility] 09/10/2017, 8:11 AM Pager   If 7PM-7AM, please contact night-coverage [email or web address] Password [email or web address]

## 2017-09-22 DIAGNOSIS — I82439 Acute embolism and thrombosis of unspecified popliteal vein: Secondary | ICD-10-CM | POA: Diagnosis not present

## 2017-09-22 DIAGNOSIS — F429 Obsessive-compulsive disorder, unspecified: Secondary | ICD-10-CM | POA: Diagnosis not present

## 2017-09-22 DIAGNOSIS — I2699 Other pulmonary embolism without acute cor pulmonale: Secondary | ICD-10-CM | POA: Diagnosis not present

## 2017-09-22 DIAGNOSIS — Z3009 Encounter for other general counseling and advice on contraception: Secondary | ICD-10-CM | POA: Diagnosis not present

## 2017-10-12 ENCOUNTER — Emergency Department (HOSPITAL_COMMUNITY)
Admission: EM | Admit: 2017-10-12 | Discharge: 2017-10-13 | Disposition: A | Discharge status: Home / Self Care | Payer: BLUE CROSS/BLUE SHIELD | Attending: Emergency Medicine | Admitting: Emergency Medicine

## 2017-10-12 ENCOUNTER — Emergency Department (HOSPITAL_COMMUNITY): Payer: BLUE CROSS/BLUE SHIELD

## 2017-10-12 ENCOUNTER — Other Ambulatory Visit: Payer: Self-pay

## 2017-10-12 ENCOUNTER — Encounter (HOSPITAL_COMMUNITY): Payer: Self-pay

## 2017-10-12 DIAGNOSIS — R079 Chest pain, unspecified: Secondary | ICD-10-CM | POA: Diagnosis not present

## 2017-10-12 DIAGNOSIS — Z79899 Other long term (current) drug therapy: Secondary | ICD-10-CM | POA: Insufficient documentation

## 2017-10-12 DIAGNOSIS — Z7901 Long term (current) use of anticoagulants: Secondary | ICD-10-CM | POA: Diagnosis not present

## 2017-10-12 DIAGNOSIS — R072 Precordial pain: Secondary | ICD-10-CM | POA: Diagnosis not present

## 2017-10-12 DIAGNOSIS — R0789 Other chest pain: Secondary | ICD-10-CM | POA: Diagnosis not present

## 2017-10-12 LAB — BASIC METABOLIC PANEL
ANION GAP: 11 (ref 5–15)
BUN: 13 mg/dL (ref 6–20)
CALCIUM: 9.7 mg/dL (ref 8.9–10.3)
CO2: 26 mmol/L (ref 22–32)
Chloride: 104 mmol/L (ref 101–111)
Creatinine, Ser: 0.64 mg/dL (ref 0.44–1.00)
GFR calc Af Amer: >60 mL/min (ref >60)
Glucose, Bld: 115 mg/dL — ABNORMAL HIGH (ref 65–99)
POTASSIUM: 4.1 mmol/L (ref 3.5–5.1)
SODIUM: 141 mmol/L (ref 135–145)

## 2017-10-12 LAB — CBC
HCT: 36.9 % (ref 36.0–46.0)
HEMOGLOBIN: 12.1 g/dL (ref 12.0–15.0)
MCH: 29.3 pg (ref 26.0–34.0)
MCHC: 32.8 g/dL (ref 30.0–36.0)
MCV: 89.3 fL (ref 78.0–100.0)
Platelets: 301 10*3/uL (ref 150–400)
RBC: 4.13 MIL/uL (ref 3.87–5.11)
RDW: 13.2 % (ref 11.5–15.5)
WBC: 7.8 10*3/uL (ref 4.0–10.5)

## 2017-10-12 LAB — I-STAT BETA HCG BLOOD, ED (MC, WL, AP ONLY)

## 2017-10-12 LAB — BRAIN NATRIURETIC PEPTIDE: B Natriuretic Peptide: 25.5 pg/mL (ref 0.0–100.0)

## 2017-10-12 LAB — I-STAT TROPONIN, ED
TROPONIN I, POC: 0 ng/mL (ref 0.00–0.08)
TROPONIN I, POC: 0 ng/mL (ref 0.00–0.08)

## 2017-10-12 MED ORDER — IOPAMIDOL (ISOVUE-370) INJECTION 76%
100.0000 mL | Freq: Once | INTRAVENOUS | Status: AC | PRN
  Administered 2017-10-12: 100 mL via INTRAVENOUS

## 2017-10-12 MED ORDER — IOPAMIDOL (ISOVUE-370) INJECTION 76%
INTRAVENOUS | Status: AC
  Filled 2017-10-12: qty 100

## 2017-10-12 NOTE — ED Provider Notes (Signed)
[name] Provider Note   CSN: [id number] Arrival date & time: 10/12/17  1837     History   Chief Complaint Chief Complaint  Patient presents with  . Chest Pain    HPI Marie Schwartz is a 27 y.o. female with a history of pulmonary embolisms who presents the emergency department with chest comfort which started today.  Patient states that this morning she noticed that she had some mild chest tightness whenever she took a deep breath.  She states is been has been occurring intermittently.  Describes the discomfort as being to her upper chest bilaterally as well as to her sternum one time.  Other than deep breath no specific alleviating or aggravating factors, does not occur with every deep breath.  She states that she did do a lot of heavy lifting at the gym yesterday.  Denies associated dyspnea, hemoptysis, leg pain/swelling, or nausea/vomiting. She reports that she came in due to the hx of pulmonary embolisms- she states this feels minimally similar given discomfort with a deep breath- but day of pulmonary embolism dx she was significant short of breath and much more uncomfortable- this does not feel that way.   She also mentions headache which has gradual onset/steady progression today. Hx of similar. No associated change in vision, numbness, weakness, or fevers. Improved with Tylenol.    HPI  Past Medical History:  Diagnosis Date  . OCD (obsessive compulsive disorder) 09/01/2017    Patient Active Problem List   Diagnosis Date Noted  . PE (pulmonary thromboembolism) (HCC) 09/08/2017  . Acute appendicitis 09/01/2017  . OCD (obsessive compulsive disorder) 09/01/2017    Past Surgical History:  Procedure Laterality Date  . Incision and drainage of peritonsilar abscess  2004   Jori Moll, MD  . LAPAROSCOPIC APPENDECTOMY N/A 09/02/2017   Procedure: APPENDECTOMY LAPAROSCOPIC;  Surgeon: Abigail Miyamoto, MD;  Location: [facility];  Service: General;   Laterality: N/A;  . TONSILLECTOMY    . WISDOM TOOTH EXTRACTION       OB History   None      Home Medications    Prior to Admission medications   Medication Sig Start Date End Date Taking? Authorizing Provider  acetaminophen (TYLENOL) 500 MG tablet Take 500 mg by mouth every 6 (six) hours as needed for mild pain.   Yes [provider]  FLUoxetine (PROZAC) 20 MG tablet Take 60 mg by mouth daily.   Yes [provider]  oxymetazoline (AFRIN) 0.05 % nasal spray Place 1 spray into both nostrils 2 (two) times daily as needed for congestion.   Yes [provider]  rivaroxaban (XARELTO) 20 MG TABS tablet Take 1 tablet (20 mg total) by mouth daily with supper. 09/30/17  Yes Marinda Elk, MD  acetaminophen (TYLENOL) 325 MG tablet Take 3 tablets (975 mg total) by mouth every 6 (six) hours as needed for mild pain (or temp > 100). Patient not taking: Reported on 10/12/2017 09/03/17   Adam Phenix, PA-C  Rivaroxaban 15 & 20 MG TBPK Take as directed on package: Start with one  tablet by mouth twice a day with food. On Day 22, switch to one  tablet once a day with food. Patient not taking: Reported on 10/12/2017 09/10/17   Marinda Elk, MD    Family History Family History  Problem Relation Age of Onset  . Pulmonary embolism Neg Hx   . Deep vein thrombosis Neg Hx     Social History Social History  Tobacco Use  . Smoking status: Never Smoker  . Smokeless tobacco: Never Used  Substance Use Topics  . Alcohol use: Never    Frequency: Never  . Drug use: Never     Allergies   Patient has no known allergies.   Review of Systems Review of Systems  Constitutional: Negative for chills and fever.  Eyes: Negative for visual disturbance.  Respiratory: Negative for shortness of breath.   Cardiovascular: Positive for chest pain. Negative for palpitations and leg swelling.  Gastrointestinal: Negative for abdominal pain, constipation, diarrhea,  nausea and vomiting.  Neurological: Positive for headaches. Negative for dizziness, syncope, weakness and numbness.  All other systems reviewed and are negative.   Physical Exam Updated Vital Signs BP (!) (P) 120/97 (BP Location: Right Arm)   Pulse (P) 65   Temp 98.1 F (36.7 C) (Oral)   Resp (P) 12   Ht  (1.626 m)   Wt 77.1 kg (170 lb)   LMP 09/09/2017   SpO2 (P) 99%   BMI 29.18 kg/m   Physical Exam  Constitutional: She appears well-developed and well-nourished. No distress.  HENT:  Head: Normocephalic and atraumatic.  Nose: Nose normal.  Mouth/Throat: Uvula is midline and oropharynx is clear and moist.  Eyes: Pupils are equal, round, and reactive to light. Conjunctivae and EOM are normal. Right eye exhibits no discharge. Left eye exhibits no discharge.  Cardiovascular: Normal rate and regular rhythm.  No murmur heard. Pulses:      Radial pulses are 2+ on the right side, and 2+ on the left side.       Dorsalis pedis pulses are 2+ on the right side, and 2+ on the left side.  Pulmonary/Chest: Effort normal and breath sounds normal. No respiratory distress. She has no decreased breath sounds. She has no wheezes. She has no rales.  Abdominal: Soft. She exhibits no distension. There is no tenderness.  Musculoskeletal:       Right lower leg: She exhibits no tenderness and no edema.       Left lower leg: She exhibits no tenderness and no edema.  Neurological: She is alert.  Clear speech.  CN III through XII grossly intact.  Sensation grossly intact bilateral upper and lower extremities.  5 out of 5 symmetric grip strength.  5 out of 5 strength with plantar dorsiflexion bilaterally.  Negative pronator drift.  Skin: Skin is warm and dry. No rash noted.  Psychiatric: She has a normal mood and affect. Her behavior is normal.  Nursing note and vitals reviewed.   ED Treatments / Results  Labs Results for orders placed or performed during the hospital encounter of 10/12/17    Basic metabolic panel  Result Value Ref Range   Sodium 141 135 - 145 mmol/L   Potassium 4.1 3.5 - 5.1 mmol/L   Chloride 104 101 - 111 mmol/L   CO2 26 22 - 32 mmol/L   Glucose, Bld 115 (H) 65 - 99 mg/dL   BUN 13 6 - 20 mg/dL   Creatinine, Ser 5.78 0.44 - 1.00 mg/dL   Calcium 9.7 8.9 - 46.9 mg/dL   GFR calc non Af Amer >60 >60 mL/min   GFR calc Af Amer >60 >60 mL/min   Anion gap 11 5 - 15  CBC  Result Value Ref Range   WBC 7.8 4.0 - 10.5 K/uL   RBC 4.13 3.87 - 5.11 MIL/uL   Hemoglobin 12.1 12.0 - 15.0 g/dL   HCT 62.9 52.8 - 41.3 %  MCV 89.3 78.0 - 100.0 fL   MCH 29.3 26.0 - 34.0 pg   MCHC 32.8 30.0 - 36.0 g/dL   RDW 16.1 09.6 - 04.5 %   Platelets 301 150 - 400 K/uL  Brain natriuretic peptide  Result Value Ref Range   B Natriuretic Peptide 25.5 0.0 - 100.0 pg/mL  I-stat troponin, ED  Result Value Ref Range   Troponin i, poc 0.00 0.00 - 0.08 ng/mL   Comment 3          I-Stat beta hCG blood, ED  Result Value Ref Range   I-stat hCG, quantitative <5.0 <5 mIU/mL   Comment 3          I-stat troponin, ED  Result Value Ref Range   Troponin i, poc 0.00 0.00 - 0.08 ng/mL   Comment 3           EKG EKG Interpretation  Date/Time:  Tuesday Oct 12 2017 18:54:36 EDT Ventricular Rate:  68 PR Interval:    QRS Duration: 78 QT Interval:  399 QTC Calculation: 425 R Axis:   71 Text Interpretation:  Sinus rhythm No significant change since last tracing Confirmed by Shaune Pollack [phone]) on 10/12/2017 10:49:46 PM   Radiology Dg Chest 2 View  Result Date: 10/12/2017 CLINICAL DATA:  Upper chest pain today.  History of PE a month ago. EXAM: CHEST - 2 VIEW COMPARISON:  Chest x-ray dated 09/08/2017 FINDINGS: Cardiomediastinal silhouette is within normal limits in size and configuration. Lungs are clear. Lung volumes are normal. No evidence of pneumonia. No pleural effusion. No pneumothorax seen. Osseous and soft tissue structures about the chest are unremarkable. IMPRESSION: No active  cardiopulmonary disease. No evidence of pneumonia or pulmonary edema. Electronically Signed   By: Bary Richard M.D.   On: 10/12/2017 19:19   Ct Angio Chest Pe W/cm &/or Wo Cm  Result Date: 10/12/2017 CLINICAL DATA:  Acute onset of upper chest tightness and sternal pressure. Headache. Patient on Xarelto. EXAM: CT ANGIOGRAPHY CHEST WITH CONTRAST TECHNIQUE: Multidetector CT imaging of the chest was performed using the standard protocol during bolus administration of intravenous contrast. Multiplanar CT image reconstructions and MIPs were obtained to evaluate the vascular anatomy. CONTRAST:  ISOVUE-370 IOPAMIDOL (ISOVUE-370) INJECTION 76% COMPARISON:  Chest radiograph performed earlier today at 7:07 p.m., and CTA of the chest 09/08/2017 FINDINGS: Cardiovascular:  There is no evidence of pulmonary embolus. The heart is normal in size. The thoracic aorta is unremarkable. The great vessels are within normal limits. Mediastinum/Nodes: The mediastinum is grossly unremarkable. No mediastinal lymphadenopathy is seen. No pericardial effusion is identified. The thyroid gland is unremarkable. No axillary lymphadenopathy is seen. Lungs/Pleura: The lungs are clear bilaterally. No focal consolidation, pleural effusion or pneumothorax is seen. No masses are identified. Upper Abdomen: The visualized portions of the liver and spleen are unremarkable. The visualized portions of the pancreas, adrenal glands and kidneys are within normal limits. Musculoskeletal: No acute osseous abnormalities are identified. The visualized musculature is unremarkable in appearance. Review of the MIP images confirms the above findings. IMPRESSION: 1. No evidence of pulmonary embolus. 2. Lungs clear bilaterally. Electronically Signed   By: Roanna Raider M.D.   On: 10/12/2017 23:16    Procedures Procedures (including critical care time)  Medications Ordered in ED Medications - No data to display   Initial Impression / Assessment and  Plan / ED Course  I have reviewed the triage vital signs and the nursing notes.  Pertinent labs & imaging results that were  available during my care of the patient were reviewed by me and considered in my medical decision making (see chart for details).   Patient presents with chest discomfort and also mentions headache.  Patient nontoxic-appearing, in no apparent distress, initial vitals WNL.  Benign physical exam.  Labs per triage reviewed.  Grossly unremarkable.  Mild hyperglycemia at 115.  No leukocytosis.  No anemia.  No significant electrolyte abnormalities.  Initial troponin WNL.  - Patient with chest discomfort with a deep breath: Chest x-ray negative for acute active cardiopulmonary disease- no evidence of pneumonia, pneumothorax, or pulmonary edema.  EKG with normal sinus rhythm, no obvious ischemia, troponin negative x2, doubt ACS.  Not a tearing sensation type pain, 2+ symmetric radial pulses, no widening of the mediastinum on imaging, doubt dissection.  Patient diagnosed with bilateral acute pulmonary emboli without right heart strain or infarct diagnosed on CT 04/10- she has been compliant with Xarelto- discussed with supervising physician Dr. Erma Heritage- CTA obtained to ensure no worsening- CTA without evidence of pulmonary embolus, lungs clear bilaterally. BNP WNL. Unclear definitive etiology of patient's sxs- instructed continued Xarelto as prescribed and Tylenol PRN with PCP follow up.   - Headache- Patient has hx of similar headaches, gradual onset with steady progression in severity. Pt is afebrile with no focal neuro deficits, dizziness, change in vision, proptosis, or nuchal rigidity. Non concerning for [facility], ICH, ischemic CVA, dural venous sinus thrombosis, acute glaucoma, giant cell arteritis, mass, or meningitis.  She has had improvement with tylneol- encouraged her to continue this and follow up with PCP.   I discussed results, treatment plan, need for PCP follow-up, and return  precautions with the patient and her mother. Provided opportunity for questions, patient and her mother confirmed understanding and are in agreement with plan.   Lodema Pilot RN attempting to input DC vitals, does not appear saved when I review on Epic, I watched her attempt this and am still unable to visualize vitals on my version of Epic, verbally discussed all vitals- HR, RR, SpO2, and BP all WNL at time of discharge at 00:18  Final Clinical Impressions(s) / ED Diagnoses   Final diagnoses:  Chest pain, unspecified type    ED Discharge Orders    None       Cherly Anderson, PA-C 10/13/17 0106    Shaune Pollack, MD 10/13/17 1015

## 2017-10-12 NOTE — ED Triage Notes (Signed)
Pt states that with inhales she feels tightness in upper chest and pressure on sternum. Pt states she was on her feet all day as well. Headache as well. Pt states she had appendectomy 4/4, then one week later had a blood clot. Pt has been taking Xarelto as prescribed. Concerned for another clot.

## 2017-10-13 NOTE — Discharge Instructions (Addendum)
You were seen in the emergency department today for chest pain.  Your work-up in the emergency department was reassuring.  Your EKG was normal.  Your labs did not show any significant abnormalities, your blood glucose is slightly elevated at 115.  The enzyme used to check your heart was normal.  The CT of your chest and your chest x-ray did not show any abnormal findings.  We are unsure of the exact cause of your chest pain.  Continue to take your Xarelto as prescribed.  You may additionally take Tylenol for the chest discomfort and for your headache.  Follow-up with your primary care provider within 3 days for reevaluation of your symptoms, return to the emergency department for new or worsening symptoms or any other concerns.

## 2017-10-16 DIAGNOSIS — J069 Acute upper respiratory infection, unspecified: Secondary | ICD-10-CM | POA: Diagnosis not present

## 2017-11-30 DIAGNOSIS — F429 Obsessive-compulsive disorder, unspecified: Secondary | ICD-10-CM | POA: Diagnosis not present

## 2017-11-30 DIAGNOSIS — E6609 Other obesity due to excess calories: Secondary | ICD-10-CM | POA: Diagnosis not present

## 2017-11-30 DIAGNOSIS — Z86711 Personal history of pulmonary embolism: Secondary | ICD-10-CM | POA: Diagnosis not present

## 2017-12-24 DIAGNOSIS — R195 Other fecal abnormalities: Secondary | ICD-10-CM | POA: Diagnosis not present

## 2017-12-27 DIAGNOSIS — N925 Other specified irregular menstruation: Secondary | ICD-10-CM | POA: Diagnosis not present

## 2017-12-27 DIAGNOSIS — Z30430 Encounter for insertion of intrauterine contraceptive device: Secondary | ICD-10-CM | POA: Diagnosis not present

## 2017-12-27 DIAGNOSIS — Z3202 Encounter for pregnancy test, result negative: Secondary | ICD-10-CM | POA: Diagnosis not present

## 2017-12-27 DIAGNOSIS — Z79899 Other long term (current) drug therapy: Secondary | ICD-10-CM | POA: Diagnosis not present

## 2017-12-27 DIAGNOSIS — Z01419 Encounter for gynecological examination (general) (routine) without abnormal findings: Secondary | ICD-10-CM | POA: Diagnosis not present

## 2018-01-26 DIAGNOSIS — R079 Chest pain, unspecified: Secondary | ICD-10-CM | POA: Diagnosis not present

## 2018-01-26 DIAGNOSIS — M940 Chondrocostal junction syndrome [Tietze]: Secondary | ICD-10-CM | POA: Diagnosis not present

## 2018-02-22 DIAGNOSIS — D2261 Melanocytic nevi of right upper limb, including shoulder: Secondary | ICD-10-CM | POA: Diagnosis not present

## 2018-02-22 DIAGNOSIS — D225 Melanocytic nevi of trunk: Secondary | ICD-10-CM | POA: Diagnosis not present

## 2018-02-22 DIAGNOSIS — D485 Neoplasm of uncertain behavior of skin: Secondary | ICD-10-CM | POA: Diagnosis not present

## 2018-02-22 DIAGNOSIS — D2262 Melanocytic nevi of left upper limb, including shoulder: Secondary | ICD-10-CM | POA: Diagnosis not present

## 2018-02-22 DIAGNOSIS — D224 Melanocytic nevi of scalp and neck: Secondary | ICD-10-CM | POA: Diagnosis not present

## 2018-02-28 DIAGNOSIS — N390 Urinary tract infection, site not specified: Secondary | ICD-10-CM | POA: Diagnosis not present

## 2018-03-09 DIAGNOSIS — L0889 Other specified local infections of the skin and subcutaneous tissue: Secondary | ICD-10-CM | POA: Diagnosis not present

## 2018-03-25 DIAGNOSIS — Z86711 Personal history of pulmonary embolism: Secondary | ICD-10-CM | POA: Diagnosis not present

## 2018-03-25 DIAGNOSIS — Z86718 Personal history of other venous thrombosis and embolism: Secondary | ICD-10-CM | POA: Diagnosis not present

## 2018-03-25 DIAGNOSIS — F429 Obsessive-compulsive disorder, unspecified: Secondary | ICD-10-CM | POA: Diagnosis not present

## 2018-03-25 DIAGNOSIS — Z23 Encounter for immunization: Secondary | ICD-10-CM | POA: Diagnosis not present

## 2018-04-27 DIAGNOSIS — Z30431 Encounter for routine checking of intrauterine contraceptive device: Secondary | ICD-10-CM | POA: Diagnosis not present

## 2018-04-27 DIAGNOSIS — Z3202 Encounter for pregnancy test, result negative: Secondary | ICD-10-CM | POA: Diagnosis not present

## 2018-05-20 ENCOUNTER — Encounter (HOSPITAL_COMMUNITY): Payer: Self-pay

## 2018-05-20 ENCOUNTER — Emergency Department (HOSPITAL_COMMUNITY)
Admission: EM | Admit: 2018-05-20 | Discharge: 2018-05-20 | Disposition: A | Discharge status: Home / Self Care | Payer: BLUE CROSS/BLUE SHIELD | Attending: Emergency Medicine | Admitting: Emergency Medicine

## 2018-05-20 ENCOUNTER — Emergency Department (HOSPITAL_COMMUNITY): Payer: BLUE CROSS/BLUE SHIELD

## 2018-05-20 DIAGNOSIS — R112 Nausea with vomiting, unspecified: Secondary | ICD-10-CM | POA: Diagnosis not present

## 2018-05-20 DIAGNOSIS — R1011 Right upper quadrant pain: Secondary | ICD-10-CM | POA: Insufficient documentation

## 2018-05-20 DIAGNOSIS — Z79899 Other long term (current) drug therapy: Secondary | ICD-10-CM | POA: Diagnosis not present

## 2018-05-20 DIAGNOSIS — K2970 Gastritis, unspecified, without bleeding: Secondary | ICD-10-CM | POA: Diagnosis not present

## 2018-05-20 DIAGNOSIS — R1013 Epigastric pain: Secondary | ICD-10-CM | POA: Diagnosis not present

## 2018-05-20 LAB — URINALYSIS, ROUTINE W REFLEX MICROSCOPIC
BILIRUBIN URINE: NEGATIVE
Glucose, UA: NEGATIVE mg/dL
HGB URINE DIPSTICK: NEGATIVE
Ketones, ur: NEGATIVE mg/dL
Leukocytes, UA: NEGATIVE
Nitrite: NEGATIVE
PROTEIN: NEGATIVE mg/dL
Specific Gravity, Urine: 1.014 (ref 1.005–1.030)
pH: 9 — ABNORMAL HIGH (ref 5.0–8.0)

## 2018-05-20 LAB — COMPREHENSIVE METABOLIC PANEL
ALK PHOS: 97 U/L (ref 38–126)
ALT: 17 U/L (ref 0–44)
ANION GAP: 9 (ref 5–15)
AST: 18 U/L (ref 15–41)
Albumin: 4.7 g/dL (ref 3.5–5.0)
BILIRUBIN TOTAL: 1.2 mg/dL (ref 0.3–1.2)
BUN: 9 mg/dL (ref 6–20)
CALCIUM: 9.5 mg/dL (ref 8.9–10.3)
CO2: 26 mmol/L (ref 22–32)
Chloride: 101 mmol/L (ref 98–111)
Creatinine, Ser: 0.66 mg/dL (ref 0.44–1.00)
GFR calc Af Amer: >60 mL/min (ref >60)
GLUCOSE: 90 mg/dL (ref 70–99)
Potassium: 3.8 mmol/L (ref 3.5–5.1)
Sodium: 136 mmol/L (ref 135–145)
TOTAL PROTEIN: 7.9 g/dL (ref 6.5–8.1)

## 2018-05-20 LAB — CBC
HCT: 42.2 % (ref 36.0–46.0)
Hemoglobin: 13.4 g/dL (ref 12.0–15.0)
MCH: 28.6 pg (ref 26.0–34.0)
MCHC: 31.8 g/dL (ref 30.0–36.0)
MCV: 90 fL (ref 80.0–100.0)
PLATELETS: 347 10*3/uL (ref 150–400)
RBC: 4.69 MIL/uL (ref 3.87–5.11)
RDW: 12.4 % (ref 11.5–15.5)
WBC: 8.9 10*3/uL (ref 4.0–10.5)
nRBC: 0 % (ref 0.0–0.2)

## 2018-05-20 LAB — LIPASE, BLOOD: Lipase: 29 U/L (ref 11–51)

## 2018-05-20 LAB — I-STAT BETA HCG BLOOD, ED (MC, WL, AP ONLY)

## 2018-05-20 MED ORDER — SODIUM CHLORIDE 0.9 % IV BOLUS
1000.0000 mL | Freq: Once | INTRAVENOUS | Status: AC
  Administered 2018-05-20: 1000 mL via INTRAVENOUS

## 2018-05-20 MED ORDER — RANITIDINE HCL 150 MG PO TABS: 150.0000 mg | ORAL_TABLET | Freq: Two times a day (BID) | ORAL | 0 refills | Status: AC

## 2018-05-20 MED ORDER — ONDANSETRON 4 MG PO TBDP: 4.0000 mg | ORAL_TABLET | Freq: Three times a day (TID) | ORAL | 0 refills | Status: DC | PRN

## 2018-05-20 MED ORDER — FAMOTIDINE IN NACL 20-0.9 MG/50ML-% IV SOLN
20.0000 mg | Freq: Once | INTRAVENOUS | Status: AC
  Administered 2018-05-20: 20 mg via INTRAVENOUS
  Filled 2018-05-20: qty 50

## 2018-05-20 MED ORDER — ONDANSETRON HCL 4 MG/2ML IJ SOLN
4.0000 mg | Freq: Once | INTRAMUSCULAR | Status: AC
  Administered 2018-05-20: 4 mg via INTRAVENOUS
  Filled 2018-05-20: qty 2

## 2018-05-20 NOTE — ED Triage Notes (Signed)
Pt reports upper R sided abdominal pain and burning. She also endorses 2 episodes of emesis today. She also reports tenderness to the RUQ with palpation. Denies diarrhea or fever. A&Ox4.

## 2018-05-20 NOTE — ED Notes (Signed)
Patient passed PO fluid challenge and able to tolerate sprite

## 2018-05-20 NOTE — ED Provider Notes (Signed)
[facility]-EMERGENCY DEPT Provider Note   CSN: [id number] Arrival date & time: 05/20/18  2014     History   Chief Complaint Chief Complaint  Patient presents with  . Abdominal Pain    HPI Marie Schwartz is a 27 y.o. female with a PMHx of OCD and remote PE no longer on xarelto, and PSHx of appendectomy, who presents to the ED with complaints of upper abdominal pain that began yesterday.  Patient describes the pain is 7/10 constant aching and burning epigastric and RUQ pain, nonradiating, worse with movement and eating, and unrelieved with Mylanta, Pepcid, and Gas-X.  She also reports associated nausea with one episode of nonbloody nonbilious emesis.  She states that she has been having more frequent bowel movements but denies having diarrhea.  She felt bloated yesterday and thought she just had gas, which is why she tried Gas-X.  She went to an urgent care who said that if it got worse that it could be her gallbladder and to come to the emergency room, which is why she came today.  Of note, she had a PE after her appendectomy earlier this year, but is no longer on Xarelto.  She denies fevers, chills, CP, SOB, diarrhea/constipation, obstipation, melena, hematochezia, hematemesis, hematuria, dysuria, vaginal bleeding/discharge, myalgias, arthralgias, numbness, tingling, focal weakness, or any other complaints at this time. Denies recent travel, sick contacts, suspicious food intake, EtOH use,or frequent NSAID use.  The history is provided by the patient and medical records. No language interpreter was used.  Abdominal Pain   Associated symptoms include nausea and vomiting. Pertinent negatives include fever, diarrhea, constipation, dysuria, hematuria, arthralgias and myalgias.    Past Medical History:  Diagnosis Date  . OCD (obsessive compulsive disorder) 09/01/2017    Patient Active Problem List   Diagnosis Date Noted  . PE (pulmonary thromboembolism) (HCC)  09/08/2017  . Acute appendicitis 09/01/2017  . OCD (obsessive compulsive disorder) 09/01/2017    Past Surgical History:  Procedure Laterality Date  . Incision and drainage of peritonsilar abscess  2004   Jori MollKarl Wolicki, MD  . LAPAROSCOPIC APPENDECTOMY N/A 09/02/2017   Procedure: APPENDECTOMY LAPAROSCOPIC;  Surgeon: Abigail MiyamotoBlackman, Douglas, MD;  Location: [facility];  Service: General;  Laterality: N/A;  . TONSILLECTOMY    . WISDOM TOOTH EXTRACTION       OB History   No obstetric history on file.      Home Medications    Prior to Admission medications   Medication Sig Start Date End Date Taking? Authorizing Provider  acetaminophen (TYLENOL) 500 MG tablet Take 500 mg by mouth every 6 (six) hours as needed for mild pain.    [provider]  FLUoxetine (PROZAC) 20 MG tablet Take 60 mg by mouth daily.    [provider]  oxymetazoline (AFRIN) 0.05 % nasal spray Place 1 spray into both nostrils 2 (two) times daily as needed for congestion.    [provider]  rivaroxaban (XARELTO) 20 MG TABS tablet Take 1 tablet (20 mg total) by mouth daily with supper. 09/30/17   Marinda ElkFeliz Ortiz, Abraham, MD    Family History Family History  Problem Relation Age of Onset  . Pulmonary embolism Neg Hx   . Deep vein thrombosis Neg Hx     Social History Social History   Tobacco Use  . Smoking status: Never Smoker  . Smokeless tobacco: Never Used  Substance Use Topics  . Alcohol use: Never    Frequency: Never  . Drug use: Never  Allergies   Patient has no known allergies.   Review of Systems Review of Systems  Constitutional: Negative for chills and fever.  Respiratory: Negative for shortness of breath.   Cardiovascular: Negative for chest pain.  Gastrointestinal: Positive for abdominal pain, nausea and vomiting. Negative for blood in stool, constipation and diarrhea.  Genitourinary: Negative for dysuria, hematuria, vaginal bleeding and vaginal discharge.    Musculoskeletal: Negative for arthralgias and myalgias.  Skin: Negative for color change.  Allergic/Immunologic: Negative for immunocompromised state.  Neurological: Negative for weakness and numbness.  Psychiatric/Behavioral: Negative for confusion.   All other systems reviewed and are negative for acute change except as noted in the HPI.    Physical Exam Updated Vital Signs BP 133/80 (BP Location: Left Arm)   Pulse 64   Temp 98.4 F (36.9 C) (Oral)   Resp 18   SpO2 98%   Physical Exam Vitals signs and nursing note reviewed.  Constitutional:      General: She is not in acute distress.    Appearance: Normal appearance. She is well-developed. She is not toxic-appearing.     Comments: Afebrile, nontoxic, NAD  HENT:     Head: Normocephalic and atraumatic.  Eyes:     General:        Right eye: No discharge.        Left eye: No discharge.     Conjunctiva/sclera: Conjunctivae normal.  Neck:     Musculoskeletal: Normal range of motion and neck supple.  Cardiovascular:     Rate and Rhythm: Normal rate and regular rhythm.     Heart sounds: Normal heart sounds, S1 normal and S2 normal. No murmur. No friction rub. No gallop.   Pulmonary:     Effort: Pulmonary effort is normal. No respiratory distress.     Breath sounds: Normal breath sounds. No decreased breath sounds, wheezing, rhonchi or rales.  Abdominal:     General: Bowel sounds are normal. There is no distension.     Palpations: Abdomen is soft. Abdomen is not rigid.     Tenderness: There is abdominal tenderness in the right upper quadrant and epigastric area. There is no right CVA tenderness, left CVA tenderness, guarding or rebound. Positive signs include Murphy's sign. Negative signs include McBurney's sign.     Comments: Soft, nondistended, +BS throughout, with moderate epigastric and RUQ TTP, no r/g/r, +murphy's, neg mcburney's, no CVA TTP   Musculoskeletal: Normal range of motion.  Skin:    General: Skin is warm and  dry.     Findings: No rash.  Neurological:     Mental Status: She is alert and oriented to person, place, and time.     Sensory: Sensation is intact. No sensory deficit.     Motor: Motor function is intact.  Psychiatric:        Mood and Affect: Mood and affect normal.        Behavior: Behavior normal.      ED Treatments / Results  Labs (all labs ordered are listed, but only abnormal results are displayed) Labs Reviewed  URINALYSIS, ROUTINE W REFLEX MICROSCOPIC - Abnormal; Notable for the following components:      Result Value   pH 9.0 (*)    All other components within normal limits  LIPASE, BLOOD  COMPREHENSIVE METABOLIC PANEL  CBC  I-STAT BETA HCG BLOOD, ED ([facility], [facility], [facility] ONLY)    EKG None  Radiology US Abdomen Limited Ruq  Result Date: 05/20/2018 CLINICAL DATA:  RIGHT upper quadrant pain  EXAM: ULTRASOUND ABDOMEN LIMITED RIGHT UPPER QUADRANT COMPARISON:  CT 09/01/2017 FINDINGS: Gallbladder: No gallstones or wall thickening visualized. No sonographic Murphy sign noted by sonographer. Common bile duct: Diameter: Normal 3 mm Liver: No focal lesion identified. Within normal limits in parenchymal echogenicity. Portal vein is patent on color Doppler imaging with normal direction of blood flow towards the liver. IMPRESSION: Normal RIGHT upper quadrant ultrasound. Electronically Signed   By: Genevive BiStewart  Edmunds M.D.   On: 05/20/2018 21:59    Procedures Procedures (including critical care time)  Medications Ordered in ED Medications  sodium chloride 0.9 % bolus 1,000 mL (1,000 mLs Intravenous New Bag/Given 05/20/18 2125)  famotidine (PEPCID) IVPB 20 mg premix (0 mg Intravenous Stopped 05/20/18 2157)  ondansetron (ZOFRAN) injection 4 mg (4 mg Intravenous Given 05/20/18 2125)     Initial Impression / Assessment and Plan / ED Course  I have reviewed the triage vital signs and the nursing notes.  Pertinent labs & imaging results that were available during my care of the patient  were reviewed by me and considered in my medical decision making (see chart for details).     27 y.o. female here with upper abdominal pain that began yesterday with associated nausea and vomiting.  On exam, epigastric and RUQ abdominal tenderness, positive Murphy sign, non-peritoneal otherwise with no other areas of tenderness.  Will obtain labs and ultrasound of right upper quadrant, patient declines pain medicine at this time, will give Zofran, fluids, Pepcid, and reassess shortly.  10:41 PM CBC WNL. CMP WNL. Lipase WNL. U/A unremarkable. BetaHCG negative. RUQ U/S negative for cholecystitis or other concerning findings. Pt feeling better and tolerating PO well here. Symptoms consistent with gastritis/GERD/PUD. Discussed diet/lifestyle modifications for symptoms, will start on zantac/zofran, advised tylenol and avoidance/sparing use of NSAIDs only on full stomach, discussed other OTC remedies for symptomatic relief, and f/up with PCP in 5-7 days for recheck of symptoms and ongoing evaluation/management. Could still be biliary dyskinesia, advised possible further outpatient work up with HIDA scan if symptoms persist; f/up with PCP for this.  I explained the diagnosis and have given explicit precautions to return to the ER including for any other new or worsening symptoms. The patient understands and accepts the medical plan as it's been dictated and I have answered their questions. Discharge instructions concerning home care and prescriptions have been given. The patient is STABLE and is discharged to home in good condition.    Final Clinical Impressions(s) / ED Diagnoses   Final diagnoses:  Abdominal pain, RUQ  Non-intractable vomiting with nausea, unspecified vomiting type  Gastritis, presence of bleeding unspecified, unspecified chronicity, unspecified gastritis type    ED Discharge Orders         Ordered    ranitidine (ZANTAC) 150 MG tablet  2 times daily     05/20/18 2239    ondansetron  (ZOFRAN ODT) 4 MG disintegrating tablet  Every 8 hours PRN     05/20/18 [address]39           Vada Swift, NoankMercedes, New JerseyPA-C 05/20/18 2242    Bethann BerkshireZammit, Joseph, MD 05/20/18 2316

## 2018-05-20 NOTE — ED Notes (Signed)
Ultrasound at bedside

## 2018-05-20 NOTE — Discharge Instructions (Signed)
Your abdominal pain is likely from gastritis or an ulcer, however your gallbladder could still potentially be the source of your symptoms; if symptoms persist, your regular doctor may end up wanting to do a HIDA scan to further evaluate your gallbladder. You will need to take zantac as directed, and avoid spicy/fatty/acidic foods, avoid soda/coffee/tea/alcohol. Avoid laying down flat within 30 minutes of eating. Avoid NSAIDs like ibuprofen/aleve/motrin/etc on an empty stomach. May consider using over the counter tums/maalox as needed for additional relief. Use zofran as directed as needed for nausea. Use tylenol as needed for pain. Follow up with your regular doctor in 5-7 days for recheck of symptoms. Return to the ER for changes or worsening symptoms.  Abdominal (belly) pain can be caused by many things. Your caregiver performed an examination and possibly ordered blood/urine tests and imaging (CT scan, x-rays, ultrasound). Many cases can be observed and treated at home after initial evaluation in the emergency department. Even though you are being discharged home, abdominal pain can be unpredictable. Therefore, you need a repeated exam if your pain does not resolve, returns, or worsens. Most patients with abdominal pain don't have to be admitted to the hospital or have surgery, but serious problems like appendicitis and gallbladder attacks can start out as nonspecific pain. Many abdominal conditions cannot be diagnosed in one visit, so follow-up evaluations are very important. SEEK IMMEDIATE MEDICAL ATTENTION IF YOU DEVELOP ANY OF THE FOLLOWING SYMPTOMS: The pain does not go away or becomes severe.  A temperature above 101 develops.  Repeated vomiting occurs (multiple episodes).  The pain becomes localized to portions of the abdomen. The right side could possibly be appendicitis. In an adult, the left lower portion of the abdomen could be colitis or diverticulitis.  Blood is being passed in stools or  vomit (bright red or black tarry stools).  Return also if you develop chest pain, difficulty breathing, dizziness or fainting, or become confused, poorly responsive, or inconsolable (young children). The constipation stays for more than 4 days.  There is belly (abdominal) or rectal pain.  You do not seem to be getting better.

## 2018-05-24 DIAGNOSIS — R1010 Upper abdominal pain, unspecified: Secondary | ICD-10-CM | POA: Diagnosis not present

## 2018-06-02 ENCOUNTER — Other Ambulatory Visit: Payer: Self-pay | Admitting: Gastroenterology

## 2018-06-02 DIAGNOSIS — K219 Gastro-esophageal reflux disease without esophagitis: Secondary | ICD-10-CM | POA: Diagnosis not present

## 2018-06-02 DIAGNOSIS — R1011 Right upper quadrant pain: Secondary | ICD-10-CM | POA: Diagnosis not present

## 2018-06-02 DIAGNOSIS — R1310 Dysphagia, unspecified: Secondary | ICD-10-CM | POA: Diagnosis not present

## 2018-06-02 DIAGNOSIS — R1033 Periumbilical pain: Secondary | ICD-10-CM | POA: Diagnosis not present

## 2018-06-08 ENCOUNTER — Other Ambulatory Visit: Payer: Self-pay | Admitting: Gastroenterology

## 2018-06-08 DIAGNOSIS — R1310 Dysphagia, unspecified: Secondary | ICD-10-CM

## 2018-06-10 ENCOUNTER — Encounter (HOSPITAL_COMMUNITY): Payer: Self-pay | Admitting: Radiology

## 2018-06-10 ENCOUNTER — Ambulatory Visit (HOSPITAL_COMMUNITY)
Admission: RE | Admit: 2018-06-10 | Discharge: 2018-06-10 | Disposition: A | Discharge status: Home / Self Care | Payer: BLUE CROSS/BLUE SHIELD | Source: Ambulatory Visit | Attending: Gastroenterology | Admitting: Gastroenterology

## 2018-06-10 DIAGNOSIS — R1011 Right upper quadrant pain: Secondary | ICD-10-CM | POA: Insufficient documentation

## 2018-06-10 DIAGNOSIS — R110 Nausea: Secondary | ICD-10-CM | POA: Insufficient documentation

## 2018-06-10 MED ORDER — TECHNETIUM TC 99M MEBROFENIN IV KIT
5.0000 | PACK | Freq: Once | INTRAVENOUS | Status: AC | PRN
  Administered 2018-06-10: 5 via INTRAVENOUS

## 2018-06-13 ENCOUNTER — Encounter (HOSPITAL_COMMUNITY): Payer: BLUE CROSS/BLUE SHIELD

## 2018-06-24 ENCOUNTER — Ambulatory Visit: Payer: BLUE CROSS/BLUE SHIELD | Admitting: Hematology

## 2018-06-24 ENCOUNTER — Other Ambulatory Visit: Payer: BLUE CROSS/BLUE SHIELD

## 2018-07-05 DIAGNOSIS — Z86711 Personal history of pulmonary embolism: Secondary | ICD-10-CM | POA: Diagnosis not present

## 2018-07-05 DIAGNOSIS — Z13220 Encounter for screening for lipoid disorders: Secondary | ICD-10-CM | POA: Diagnosis not present

## 2018-07-05 DIAGNOSIS — Z0000 Encounter for general adult medical examination without abnormal findings: Secondary | ICD-10-CM | POA: Diagnosis not present

## 2018-07-05 DIAGNOSIS — F429 Obsessive-compulsive disorder, unspecified: Secondary | ICD-10-CM | POA: Diagnosis not present

## 2018-08-19 DIAGNOSIS — F429 Obsessive-compulsive disorder, unspecified: Secondary | ICD-10-CM | POA: Diagnosis not present

## 2018-08-19 DIAGNOSIS — J309 Allergic rhinitis, unspecified: Secondary | ICD-10-CM | POA: Diagnosis not present

## 2018-08-19 DIAGNOSIS — F411 Generalized anxiety disorder: Secondary | ICD-10-CM | POA: Diagnosis not present

## 2018-09-07 DIAGNOSIS — Z01419 Encounter for gynecological examination (general) (routine) without abnormal findings: Secondary | ICD-10-CM | POA: Diagnosis not present

## 2018-09-07 DIAGNOSIS — Z113 Encounter for screening for infections with a predominantly sexual mode of transmission: Secondary | ICD-10-CM | POA: Diagnosis not present

## 2018-09-07 DIAGNOSIS — Z6827 Body mass index (BMI) 27.0-27.9, adult: Secondary | ICD-10-CM | POA: Diagnosis not present

## 2018-10-27 DIAGNOSIS — R0982 Postnasal drip: Secondary | ICD-10-CM | POA: Diagnosis not present

## 2018-10-27 DIAGNOSIS — R110 Nausea: Secondary | ICD-10-CM | POA: Diagnosis not present

## 2018-11-14 DIAGNOSIS — H1133 Conjunctival hemorrhage, bilateral: Secondary | ICD-10-CM | POA: Diagnosis not present

## 2018-11-30 DIAGNOSIS — H43811 Vitreous degeneration, right eye: Secondary | ICD-10-CM | POA: Diagnosis not present

## 2018-11-30 DIAGNOSIS — H5213 Myopia, bilateral: Secondary | ICD-10-CM | POA: Diagnosis not present

## 2019-01-03 DIAGNOSIS — F429 Obsessive-compulsive disorder, unspecified: Secondary | ICD-10-CM | POA: Diagnosis not present

## 2019-02-22 DIAGNOSIS — D225 Melanocytic nevi of trunk: Secondary | ICD-10-CM | POA: Diagnosis not present

## 2019-02-22 DIAGNOSIS — D224 Melanocytic nevi of scalp and neck: Secondary | ICD-10-CM | POA: Diagnosis not present

## 2019-02-22 DIAGNOSIS — L700 Acne vulgaris: Secondary | ICD-10-CM | POA: Diagnosis not present

## 2019-02-22 DIAGNOSIS — D2262 Melanocytic nevi of left upper limb, including shoulder: Secondary | ICD-10-CM | POA: Diagnosis not present

## 2019-03-21 DIAGNOSIS — Z23 Encounter for immunization: Secondary | ICD-10-CM | POA: Diagnosis not present

## 2019-03-31 ENCOUNTER — Emergency Department (HOSPITAL_COMMUNITY)
Admission: EM | Admit: 2019-03-31 | Discharge: 2019-04-01 | Disposition: A | Discharge status: Home / Self Care | Payer: BC Managed Care – PPO | Attending: Emergency Medicine | Admitting: Emergency Medicine

## 2019-03-31 ENCOUNTER — Emergency Department (HOSPITAL_COMMUNITY): Payer: BC Managed Care – PPO

## 2019-03-31 ENCOUNTER — Encounter (HOSPITAL_COMMUNITY): Payer: Self-pay | Admitting: Emergency Medicine

## 2019-03-31 ENCOUNTER — Other Ambulatory Visit: Payer: Self-pay

## 2019-03-31 DIAGNOSIS — Z20828 Contact with and (suspected) exposure to other viral communicable diseases: Secondary | ICD-10-CM | POA: Diagnosis not present

## 2019-03-31 DIAGNOSIS — R0602 Shortness of breath: Secondary | ICD-10-CM | POA: Diagnosis not present

## 2019-03-31 DIAGNOSIS — R0789 Other chest pain: Secondary | ICD-10-CM | POA: Insufficient documentation

## 2019-03-31 DIAGNOSIS — Z79899 Other long term (current) drug therapy: Secondary | ICD-10-CM | POA: Insufficient documentation

## 2019-03-31 DIAGNOSIS — R0600 Dyspnea, unspecified: Secondary | ICD-10-CM | POA: Insufficient documentation

## 2019-03-31 DIAGNOSIS — R079 Chest pain, unspecified: Secondary | ICD-10-CM | POA: Diagnosis not present

## 2019-03-31 LAB — CBC
HCT: 40.3 % (ref 36.0–46.0)
Hemoglobin: 12.8 g/dL (ref 12.0–15.0)
MCH: 30 pg (ref 26.0–34.0)
MCHC: 31.8 g/dL (ref 30.0–36.0)
MCV: 94.6 fL (ref 80.0–100.0)
Platelets: 340 10*3/uL (ref 150–400)
RBC: 4.26 MIL/uL (ref 3.87–5.11)
RDW: 12.3 % (ref 11.5–15.5)
WBC: 9.5 10*3/uL (ref 4.0–10.5)
nRBC: 0 % (ref 0.0–0.2)

## 2019-03-31 LAB — BASIC METABOLIC PANEL
Anion gap: 9 (ref 5–15)
BUN: 13 mg/dL (ref 6–20)
CO2: 28 mmol/L (ref 22–32)
Calcium: 9.2 mg/dL (ref 8.9–10.3)
Chloride: 103 mmol/L (ref 98–111)
Creatinine, Ser: 0.64 mg/dL (ref 0.44–1.00)
GFR calc Af Amer: >60 mL/min (ref >60)
GFR calc non Af Amer: >60 mL/min (ref >60)
Glucose, Bld: 94 mg/dL (ref 70–99)
Potassium: 3.9 mmol/L (ref 3.5–5.1)
Sodium: 140 mmol/L (ref 135–145)

## 2019-03-31 LAB — I-STAT BETA HCG BLOOD, ED (NOT ORDERABLE): I-stat hCG, quantitative: <5 m[IU]/mL (ref <5)

## 2019-03-31 LAB — TROPONIN I (HIGH SENSITIVITY): Troponin I (High Sensitivity): <2 ng/L (ref <18)

## 2019-03-31 NOTE — ED Provider Notes (Signed)
[facility] DEPT Provider Note   CSN: [id number] Arrival date & time: 03/31/19  1730     History   Chief Complaint Chief Complaint  Patient presents with  . Chest Pain  . Shortness of Breath    HPI Marie Schwartz is a 28 y.o. female with history of PE who presents with chest tightness and shortness of breath.  Patient states that for the past 4 days she has had mild intermittent chest tightness.  She also has had a mild headache and thought that it may be due to drinking too much caffeine so she stopped drinking caffeine and then her headache got worse so she started drinking caffeine again and the chest tightness got worse.  She reports mild associated shortness of breath when she is talking.  The tightness in the shortness of breath is worse with exertion and better with rest.  This morning she felt pain radiating down her arm with tingling and this prompted her to come to the emergency department.  She was diagnosed with a PE last year and this was attributed to her recent appendectomy and been on oral birth control.  She now has an IUD for birth control.  She denies any recent travel or surgeries.  She denies fever, syncope, cough, wheezing, back pain or abdominal pain, calf pain or leg swelling.     HPI  Past Medical History:  Diagnosis Date  . OCD (obsessive compulsive disorder) 09/01/2017    Patient Active Problem List   Diagnosis Date Noted  . PE (pulmonary thromboembolism) ([place]) 09/08/2017  . Acute appendicitis 09/01/2017  . OCD (obsessive compulsive disorder) 09/01/2017    Past Surgical History:  Procedure Laterality Date  . Incision and drainage of peritonsilar abscess  [id number]   Kennith Center, MD  . LAPAROSCOPIC APPENDECTOMY N/A 09/02/2017   Procedure: APPENDECTOMY LAPAROSCOPIC;  Surgeon: Coralie Keens, MD;  Location: [facility];  Service: General;  Laterality: N/A;  . TONSILLECTOMY    . WISDOM TOOTH EXTRACTION       OB History   No  obstetric history on file.      Home Medications    Prior to Admission medications   Medication Sig Start Date End Date Taking? Authorizing Provider  acetaminophen (TYLENOL) 500 MG tablet Take 500 mg by mouth every 6 (six) hours as needed for mild pain.   Yes [provider]  albuterol (PROVENTIL HFA;VENTOLIN HFA) 108 (90 Base) MCG/ACT inhaler Inhale 1-2 puffs into the lungs every 6 (six) hours as needed for wheezing or shortness of breath.   Yes [provider]  FLUoxetine (PROZAC) 20 MG tablet Take 60 mg by mouth daily.   Yes [provider]  fluticasone (FLONASE) 50 MCG/ACT nasal spray Place 1 spray into both nostrils daily as needed for allergies or rhinitis.   Yes [provider]  Levonorgestrel (KYLEENA) 19.5 MG IUD 19.5 each by Intrauterine route.   Yes [provider]  ketotifen (ZADITOR) 0.025 % ophthalmic solution Place 1 drop into both eyes 2 (two) times daily as needed (allergies).    [provider]  ondansetron (ZOFRAN ODT) 4 MG disintegrating tablet Take 1 tablet (4 mg total) by mouth every 8 (eight) hours as needed for nausea or vomiting. Patient not taking: Reported on 03/31/2019 05/20/18   Street, [place], PA-C  ranitidine (ZANTAC) 150 MG tablet Take 1 tablet (150 mg total) by mouth 2 (two) times daily. Patient not taking: Reported on 03/31/2019 05/20/18   Street, [place], [place]  Family History Family History  Problem Relation Age of Onset  . Pulmonary embolism Neg Hx   . Deep vein thrombosis Neg Hx     Social History Social History   Tobacco Use  . Smoking status: Never Smoker  . Smokeless tobacco: Never Used  Substance Use Topics  . Alcohol use: Never    Frequency: Never  . Drug use: Never     Allergies   Patient has no known allergies.   Review of Systems Review of Systems  Constitutional: Negative for fever.  Respiratory: Positive for chest tightness and shortness of breath. Negative for  cough and wheezing.   Cardiovascular: Negative for chest pain and leg swelling.  Gastrointestinal: Negative for abdominal pain.  Neurological: Positive for headaches. Negative for syncope.  All other systems reviewed and are negative.    Physical Exam Updated Vital Signs BP 131/80 (BP Location: Left Arm)   Pulse 63   Temp 98.5 F (36.9 C) (Oral)   Resp 18   SpO2 100%   Physical Exam Vitals signs and nursing note reviewed.  Constitutional:      General: She is not in acute distress.    Appearance: She is well-developed. She is not ill-appearing.  HENT:     Head: Normocephalic and atraumatic.  Eyes:     General: No scleral icterus.       Right eye: No discharge.        Left eye: No discharge.     Conjunctiva/sclera: Conjunctivae normal.     Pupils: Pupils are equal, round, and reactive to light.  Neck:     Musculoskeletal: Normal range of motion.  Cardiovascular:     Rate and Rhythm: Normal rate and regular rhythm.  Pulmonary:     Effort: Pulmonary effort is normal. No respiratory distress.     Breath sounds: Normal breath sounds.  Abdominal:     General: There is no distension.     Palpations: Abdomen is soft.     Tenderness: There is no abdominal tenderness.  Musculoskeletal:     Right lower leg: No edema.     Left lower leg: No edema.  Skin:    General: Skin is warm and dry.  Neurological:     Mental Status: She is alert and oriented to person, place, and time.  Psychiatric:        Behavior: Behavior normal.      ED Treatments / Results  Labs (all labs ordered are listed, but only abnormal results are displayed) Labs Reviewed  SARS CORONAVIRUS 2 (TAT 6-24 HRS)  BASIC METABOLIC PANEL  CBC  D-DIMER, QUANTITATIVE (NOT [facility])  I-STAT BETA HCG BLOOD, ED ([facility], [facility], [facility] ONLY)  I-STAT BETA HCG BLOOD, ED (NOT ORDERABLE)  TROPONIN I (HIGH SENSITIVITY)  TROPONIN I (HIGH SENSITIVITY)    EKG EKG Interpretation  Date/Time:  Friday March 31 2019 17:40:50 EDT  Ventricular Rate:  81 PR Interval:    QRS Duration: 85 QT Interval:  360 QTC Calculation: 418 R Axis:   67 Text Interpretation: Sinus rhythm Confirmed by Marianna Fussykstra, Richard ([id number]) on 03/31/2019 10:47:39 PM   Radiology Dg Chest 2 View  Result Date: 03/31/2019 CLINICAL DATA:  Chest pain for 3 days across the anterior chest, shortness of breath EXAM: CHEST - 2 VIEW COMPARISON:  CTA chest Oct 12, 2017, radiograph Oct 12, 2017 FINDINGS: No consolidation, features of edema, pneumothorax, or effusion. Pulmonary vascularity is normally distributed. The cardiomediastinal contours are unremarkable. No acute osseous or soft tissue abnormality.  IMPRESSION: No acute cardiopulmonary abnormality. Electronically Signed   By: Kreg Shropshire M.D.   On: 03/31/2019 18:32    Procedures Procedures (including critical care time)  Medications Ordered in ED Medications - No data to display   Initial Impression / Assessment and Plan / ED Course  I have reviewed the triage vital signs and the nursing notes.  Pertinent labs & imaging results that were available during my care of the patient were reviewed by me and considered in my medical decision making (see chart for details).  28 year old female with history of PE presents with vague intermittent chest tightness and SOB with an associated headache for several days. Initially she is hypertensive which has resolved on repeat readings. Other vitals are normal. Her exam is unremarkable. EKG is SR. CXR is negative. Will obtain labs and add on D-dimer.  Labs are normal. D-dimer is normal. Trop is <2. Do not think she needs a repeat as pain is atypical and has been ongoing for days. COVID test was offered due to unexplained SOB and she would like to be tested. Unclear etiology of symptoms - she thinks it may be anxiety which is possible. Advised to self-quarantine until she receives result from her test and to f/u with her PCP. She also has an inhaler which is old so  this was refilled and she was advised to try this to see if there is improvement in symptoms.  Marie Schwartz was evaluated in Emergency Department on 04/01/2019 for the symptoms described in the history of present illness. She was evaluated in the context of the global COVID-19 pandemic, which necessitated consideration that the patient might be at risk for infection with the SARS-CoV-2 virus that causes COVID-19. Institutional protocols and algorithms that pertain to the evaluation of patients at risk for COVID-19 are in a state of rapid change based on information released by regulatory bodies including the CDC and federal and state organizations. These policies and algorithms were followed during the patient's care in the ED.   Final Clinical Impressions(s) / ED Diagnoses   Final diagnoses:  Dyspnea, unspecified type  Chest tightness    ED Discharge Orders    None       Bethel Born, PA-C 04/01/19 1509    Milagros Loll, MD 04/01/19 [phone]

## 2019-03-31 NOTE — ED Triage Notes (Signed)
Intermittent upper chest pains x 4 days. SOB after finishes talking that started today. Headache yesterday that is not as bad today. Arms tingling today as well.

## 2019-04-01 LAB — SARS CORONAVIRUS 2 (TAT 6-24 HRS): SARS Coronavirus 2: NEGATIVE

## 2019-04-01 LAB — D-DIMER, QUANTITATIVE: D-Dimer, Quant: <0.27 ug{FEU}/mL (ref 0.00–0.50)

## 2019-04-01 MED ORDER — ALBUTEROL SULFATE HFA 108 (90 BASE) MCG/ACT IN AERS: 1.0000 | INHALATION_SPRAY | Freq: Four times a day (QID) | RESPIRATORY_TRACT | 0 refills | Status: AC | PRN

## 2019-04-01 NOTE — Discharge Instructions (Signed)
Try inhaler for shortness of breath Please quarantine until you receive results from your COVID test Follow up with your doctor Return if worsening

## 2019-04-26 DIAGNOSIS — M79671 Pain in right foot: Secondary | ICD-10-CM | POA: Diagnosis not present

## 2019-05-05 ENCOUNTER — Ambulatory Visit
Admission: RE | Admit: 2019-05-05 | Discharge: 2019-05-05 | Disposition: A | Discharge status: Home / Self Care | Payer: BC Managed Care – PPO | Source: Ambulatory Visit | Attending: Family Medicine | Admitting: Family Medicine

## 2019-05-05 ENCOUNTER — Other Ambulatory Visit: Payer: Self-pay | Admitting: Family Medicine

## 2019-05-05 DIAGNOSIS — M25571 Pain in right ankle and joints of right foot: Secondary | ICD-10-CM

## 2019-05-17 DIAGNOSIS — M25571 Pain in right ankle and joints of right foot: Secondary | ICD-10-CM | POA: Diagnosis not present

## 2019-05-31 ENCOUNTER — Ambulatory Visit: Payer: BC Managed Care – PPO | Attending: Family Medicine

## 2019-05-31 DIAGNOSIS — Z20828 Contact with and (suspected) exposure to other viral communicable diseases: Secondary | ICD-10-CM | POA: Diagnosis not present

## 2019-05-31 DIAGNOSIS — Z20822 Contact with and (suspected) exposure to covid-19: Secondary | ICD-10-CM

## 2019-06-01 LAB — NOVEL CORONAVIRUS, NAA: SARS-CoV-2, NAA: NOT DETECTED

## 2019-06-08 DIAGNOSIS — M79671 Pain in right foot: Secondary | ICD-10-CM | POA: Diagnosis not present

## 2019-06-08 DIAGNOSIS — M25571 Pain in right ankle and joints of right foot: Secondary | ICD-10-CM | POA: Diagnosis not present

## 2019-06-29 DIAGNOSIS — Z1211 Encounter for screening for malignant neoplasm of colon: Secondary | ICD-10-CM | POA: Diagnosis not present

## 2019-06-29 DIAGNOSIS — K625 Hemorrhage of anus and rectum: Secondary | ICD-10-CM | POA: Diagnosis not present

## 2019-06-29 DIAGNOSIS — Z8379 Family history of other diseases of the digestive system: Secondary | ICD-10-CM | POA: Diagnosis not present

## 2019-06-29 DIAGNOSIS — K600 Acute anal fissure: Secondary | ICD-10-CM | POA: Diagnosis not present

## 2019-07-06 DIAGNOSIS — U071 COVID-19: Secondary | ICD-10-CM | POA: Diagnosis not present

## 2019-07-06 DIAGNOSIS — Z20828 Contact with and (suspected) exposure to other viral communicable diseases: Secondary | ICD-10-CM | POA: Diagnosis not present

## 2019-07-06 DIAGNOSIS — Z03818 Encounter for observation for suspected exposure to other biological agents ruled out: Secondary | ICD-10-CM | POA: Diagnosis not present

## 2019-07-06 DIAGNOSIS — M25571 Pain in right ankle and joints of right foot: Secondary | ICD-10-CM | POA: Diagnosis not present

## 2019-07-07 DIAGNOSIS — U071 COVID-19: Secondary | ICD-10-CM | POA: Diagnosis not present

## 2019-07-26 DIAGNOSIS — M25571 Pain in right ankle and joints of right foot: Secondary | ICD-10-CM | POA: Diagnosis not present

## 2019-07-31 DIAGNOSIS — M722 Plantar fascial fibromatosis: Secondary | ICD-10-CM | POA: Diagnosis not present

## 2019-07-31 DIAGNOSIS — M25571 Pain in right ankle and joints of right foot: Secondary | ICD-10-CM | POA: Diagnosis not present

## 2019-09-07 DIAGNOSIS — Z8379 Family history of other diseases of the digestive system: Secondary | ICD-10-CM | POA: Diagnosis not present

## 2019-09-07 DIAGNOSIS — K625 Hemorrhage of anus and rectum: Secondary | ICD-10-CM | POA: Diagnosis not present

## 2019-09-16 ENCOUNTER — Emergency Department (HOSPITAL_BASED_OUTPATIENT_CLINIC_OR_DEPARTMENT_OTHER)
Admission: EM | Admit: 2019-09-16 | Discharge: 2019-09-16 | Disposition: A | Discharge status: Home / Self Care | Payer: BC Managed Care – PPO | Attending: Emergency Medicine | Admitting: Emergency Medicine

## 2019-09-16 ENCOUNTER — Other Ambulatory Visit: Payer: Self-pay

## 2019-09-16 ENCOUNTER — Encounter (HOSPITAL_BASED_OUTPATIENT_CLINIC_OR_DEPARTMENT_OTHER): Payer: Self-pay | Admitting: Emergency Medicine

## 2019-09-16 DIAGNOSIS — Z1152 Encounter for screening for COVID-19: Secondary | ICD-10-CM | POA: Diagnosis not present

## 2019-09-16 DIAGNOSIS — Z79899 Other long term (current) drug therapy: Secondary | ICD-10-CM | POA: Insufficient documentation

## 2019-09-16 DIAGNOSIS — R1084 Generalized abdominal pain: Secondary | ICD-10-CM | POA: Diagnosis not present

## 2019-09-16 DIAGNOSIS — R112 Nausea with vomiting, unspecified: Secondary | ICD-10-CM

## 2019-09-16 DIAGNOSIS — R197 Diarrhea, unspecified: Secondary | ICD-10-CM | POA: Insufficient documentation

## 2019-09-16 DIAGNOSIS — R1110 Vomiting, unspecified: Secondary | ICD-10-CM | POA: Diagnosis not present

## 2019-09-16 LAB — COMPREHENSIVE METABOLIC PANEL
ALT: 39 U/L (ref 0–44)
AST: 28 U/L (ref 15–41)
Albumin: 4.4 g/dL (ref 3.5–5.0)
Alkaline Phosphatase: 66 U/L (ref 38–126)
Anion gap: 12 (ref 5–15)
BUN: 16 mg/dL (ref 6–20)
CO2: 22 mmol/L (ref 22–32)
Calcium: 9.1 mg/dL (ref 8.9–10.3)
Chloride: 100 mmol/L (ref 98–111)
Creatinine, Ser: 0.61 mg/dL (ref 0.44–1.00)
GFR calc Af Amer: >60 mL/min (ref >60)
GFR calc non Af Amer: >60 mL/min (ref >60)
Glucose, Bld: 126 mg/dL — ABNORMAL HIGH (ref 70–99)
Potassium: 3.2 mmol/L — ABNORMAL LOW (ref 3.5–5.1)
Sodium: 134 mmol/L — ABNORMAL LOW (ref 135–145)
Total Bilirubin: 1.4 mg/dL — ABNORMAL HIGH (ref 0.3–1.2)
Total Protein: 7.6 g/dL (ref 6.5–8.1)

## 2019-09-16 LAB — URINALYSIS, ROUTINE W REFLEX MICROSCOPIC
Bilirubin Urine: NEGATIVE
Glucose, UA: NEGATIVE mg/dL
Hgb urine dipstick: NEGATIVE
Ketones, ur: 15 mg/dL — AB
Leukocytes,Ua: NEGATIVE
Nitrite: NEGATIVE
Protein, ur: 30 mg/dL — AB
Specific Gravity, Urine: 1.015 (ref 1.005–1.030)
pH: 8.5 — ABNORMAL HIGH (ref 5.0–8.0)

## 2019-09-16 LAB — URINALYSIS, MICROSCOPIC (REFLEX)

## 2019-09-16 LAB — CBC WITH DIFFERENTIAL/PLATELET
Abs Immature Granulocytes: 0.02 10*3/uL (ref 0.00–0.07)
Basophils Absolute: 0 10*3/uL (ref 0.0–0.1)
Basophils Relative: 0 %
Eosinophils Absolute: 0 10*3/uL (ref 0.0–0.5)
Eosinophils Relative: 0 %
HCT: 40 % (ref 36.0–46.0)
Hemoglobin: 13.6 g/dL (ref 12.0–15.0)
Immature Granulocytes: 0 %
Lymphocytes Relative: 3 %
Lymphs Abs: 0.2 10*3/uL — ABNORMAL LOW (ref 0.7–4.0)
MCH: 30.1 pg (ref 26.0–34.0)
MCHC: 34 g/dL (ref 30.0–36.0)
MCV: 88.5 fL (ref 80.0–100.0)
Monocytes Absolute: 0.5 10*3/uL (ref 0.1–1.0)
Monocytes Relative: 6 %
Neutro Abs: 7 10*3/uL (ref 1.7–7.7)
Neutrophils Relative %: 91 %
Platelets: 301 10*3/uL (ref 150–400)
RBC: 4.52 MIL/uL (ref 3.87–5.11)
RDW: 12.4 % (ref 11.5–15.5)
WBC: 7.7 10*3/uL (ref 4.0–10.5)
nRBC: 0 % (ref 0.0–0.2)

## 2019-09-16 LAB — PREGNANCY, URINE: Preg Test, Ur: NEGATIVE

## 2019-09-16 MED ORDER — ACETAMINOPHEN 325 MG PO TABS
650.0000 mg | ORAL_TABLET | Freq: Once | ORAL | Status: AC
  Administered 2019-09-16: 14:00:00 650 mg via ORAL
  Filled 2019-09-16: qty 2

## 2019-09-16 MED ORDER — ONDANSETRON 4 MG PO TBDP: 4.0000 mg | ORAL_TABLET | Freq: Three times a day (TID) | ORAL | 0 refills | Status: AC | PRN

## 2019-09-16 MED ORDER — SODIUM CHLORIDE 0.9 % IV BOLUS
1000.0000 mL | Freq: Once | INTRAVENOUS | Status: AC
  Administered 2019-09-16: 14:00:00 1000 mL via INTRAVENOUS

## 2019-09-16 MED ORDER — ONDANSETRON HCL 4 MG/2ML IJ SOLN
4.0000 mg | Freq: Once | INTRAMUSCULAR | Status: AC
  Administered 2019-09-16: 4 mg via INTRAVENOUS
  Filled 2019-09-16: qty 2

## 2019-09-16 MED ORDER — SODIUM CHLORIDE 0.9 % IV BOLUS
1000.0000 mL | Freq: Once | INTRAVENOUS | Status: AC
  Administered 2019-09-16: 15:00:00 1000 mL via INTRAVENOUS

## 2019-09-16 NOTE — ED Provider Notes (Signed)
[facility] EMERGENCY DEPARTMENT Provider Note   CSN: [id number] Arrival date & time: 09/16/19  1254     History Chief Complaint  Patient presents with  . Abdominal Pain    Emesis and diarrhea    Marie Schwartz is a 29 y.o. female.  The history is provided by the patient. No language interpreter was used.  Abdominal Pain Pain location:  Generalized Pain quality: aching   Pain severity:  Moderate Onset quality:  Gradual Duration:  1 day Timing:  Constant Progression:  Worsening Chronicity:  New Relieved by:  Nothing Worsened by:  Nothing Ineffective treatments:  None tried Associated symptoms: diarrhea, nausea and vomiting   Risk factors: not pregnant        Past Medical History:  Diagnosis Date  . OCD (obsessive compulsive disorder) 09/01/2017    Patient Active Problem List   Diagnosis Date Noted  . PE (pulmonary thromboembolism) ([place]) 09/08/2017  . Acute appendicitis 09/01/2017  . OCD (obsessive compulsive disorder) 09/01/2017    Past Surgical History:  Procedure Laterality Date  . APPENDECTOMY    . Incision and drainage of peritonsilar abscess  2683   Kennith Center, MD  . LAPAROSCOPIC APPENDECTOMY N/A 09/02/2017   Procedure: APPENDECTOMY LAPAROSCOPIC;  Surgeon: Coralie Keens, MD;  Location: [facility];  Service: General;  Laterality: N/A;  . TONSILLECTOMY    . WISDOM TOOTH EXTRACTION       OB History   No obstetric history on file.     Family History  Problem Relation Age of Onset  . Pulmonary embolism Neg Hx   . Deep vein thrombosis Neg Hx     Social History   Tobacco Use  . Smoking status: Never Smoker  . Smokeless tobacco: Never Used  Substance Use Topics  . Alcohol use: Never  . Drug use: Never    Home Medications Prior to Admission medications   Medication Sig Start Date End Date Taking? Authorizing Provider  acetaminophen (TYLENOL) 500 MG tablet Take 500 mg by mouth every 6 (six) hours as needed for mild pain.     [provider]  albuterol (VENTOLIN HFA) 108 (90 Base) MCG/ACT inhaler Inhale 1-2 puffs into the lungs every 6 (six) hours as needed for wheezing or shortness of breath. 04/01/19   Recardo Evangelist, PA-C  FLUoxetine (PROZAC) 20 MG tablet Take 60 mg by mouth daily.    [provider]  fluticasone (FLONASE) 50 MCG/ACT nasal spray Place 1 spray into both nostrils daily as needed for allergies or rhinitis.    [provider]  ketotifen (ZADITOR) 0.025 % ophthalmic solution Place 1 drop into both eyes 2 (two) times daily as needed (allergies).    [provider]  Levonorgestrel (KYLEENA) 19.5 MG IUD 19.5 each by Intrauterine route.    [provider]  ondansetron (ZOFRAN ODT) 4 MG disintegrating tablet Take 1 tablet (4 mg total) by mouth every 8 (eight) hours as needed for nausea or vomiting. 09/16/19   Fransico Meadow, PA-C  ranitidine (ZANTAC) 150 MG tablet Take 1 tablet (150 mg total) by mouth 2 (two) times daily. Patient not taking: Reported on 03/31/2019 05/20/18   Street, [place], [place]    Allergies    Patient has no known allergies.  Review of Systems   Review of Systems  Gastrointestinal: Positive for abdominal pain, diarrhea, nausea and vomiting.  All other systems reviewed and are negative.   Physical Exam Updated Vital Signs BP (!) 109/52 (BP Location: Left Arm)  Pulse (!) 103   Temp 99.1 F (37.3 C) (Oral)   Resp 18   Ht 5\' 4"  (1.626 m)   Wt 79.4 kg   SpO2 98%   BMI 30.04 kg/m   Physical Exam Vitals and nursing note reviewed.  Constitutional:      Appearance: She is well-developed.  HENT:     Head: Normocephalic.  Cardiovascular:     Rate and Rhythm: Regular rhythm. Tachycardia present.  Pulmonary:     Effort: Pulmonary effort is normal.  Abdominal:     General: Abdomen is flat. Bowel sounds are normal. There is no distension.     Palpations: Abdomen is soft.     Tenderness: There is no abdominal tenderness.   Musculoskeletal:        General: Normal range of motion.     Cervical back: Normal range of motion.  Skin:    General: Skin is warm.  Neurological:     General: No focal deficit present.     Mental Status: She is alert and oriented to person, place, and time.  Psychiatric:        Mood and Affect: Mood normal.     ED Results / Procedures / Treatments   Labs (all labs ordered are listed, but only abnormal results are displayed) Labs Reviewed  CBC WITH DIFFERENTIAL/PLATELET - Abnormal; Notable for the following components:      Result Value   Lymphs Abs 0.2 (*)    All other components within normal limits  COMPREHENSIVE METABOLIC PANEL - Abnormal; Notable for the following components:   Sodium 134 (*)    Potassium 3.2 (*)    Glucose, Bld 126 (*)    Total Bilirubin 1.4 (*)    All other components within normal limits  URINALYSIS, ROUTINE W REFLEX MICROSCOPIC - Abnormal; Notable for the following components:   APPearance CLOUDY (*)    pH 8.5 (*)    Ketones, ur 15 (*)    Protein, ur 30 (*)    All other components within normal limits  URINALYSIS, MICROSCOPIC (REFLEX) - Abnormal; Notable for the following components:   Bacteria, UA FEW (*)    All other components within normal limits  PREGNANCY, URINE    EKG None  Radiology No results found.  Procedures Procedures (including critical care time)  Medications Ordered in ED Medications  sodium chloride 0.9 % bolus 1,000 mL (0 mLs Intravenous Stopped 09/16/19 1430)  ondansetron (ZOFRAN) injection 4 mg (4 mg Intravenous Given 09/16/19 1343)  acetaminophen (TYLENOL) tablet 650 mg (650 mg Oral Given 09/16/19 1355)  sodium chloride 0.9 % bolus 1,000 mL (0 mLs Intravenous Stopped 09/16/19 1553)    ED Course  I have reviewed the triage vital signs and the nursing notes.  Pertinent labs & imaging results that were available during my care of the patient were reviewed by me and considered in my medical decision making (see chart  for details).    MDM Rules/Calculators/A&P                      MDM: Pt given zofran IV and 2 liters of fluids.  Pt reports she feels much better,  Pt tolerating po fluids.  Pt given rx for zofran  Final Clinical Impression(s) / ED Diagnoses Final diagnoses:  Nausea vomiting and diarrhea    Rx / DC Orders ED Discharge Orders         Ordered    ondansetron (ZOFRAN ODT) 4 MG disintegrating tablet  Every 8 hours PRN     09/16/19 1547        An After Visit Summary was printed and given to the patient.    Elson Areas, PA-C 09/16/19 1705    Virgina Norfolk, DO 09/17/19 [phone]

## 2019-09-16 NOTE — ED Notes (Signed)
Marie Schwartz ED Provider at bedside. 

## 2019-09-16 NOTE — ED Triage Notes (Signed)
Pt c/o abdominal pain with N/V/D since last night. P denies fever, c/o chills. Presents from UC, requesting fluids, covid test pending. Pt reports Covid+ February, 2021

## 2019-09-16 NOTE — Discharge Instructions (Signed)
Return if any problems.

## 2019-09-19 DIAGNOSIS — K529 Noninfective gastroenteritis and colitis, unspecified: Secondary | ICD-10-CM | POA: Diagnosis not present

## 2019-09-19 DIAGNOSIS — R05 Cough: Secondary | ICD-10-CM | POA: Diagnosis not present

## 2019-10-10 DIAGNOSIS — Z01419 Encounter for gynecological examination (general) (routine) without abnormal findings: Secondary | ICD-10-CM | POA: Diagnosis not present

## 2019-10-10 DIAGNOSIS — R635 Abnormal weight gain: Secondary | ICD-10-CM | POA: Diagnosis not present

## 2019-10-10 DIAGNOSIS — N760 Acute vaginitis: Secondary | ICD-10-CM | POA: Diagnosis not present

## 2019-10-10 DIAGNOSIS — Z113 Encounter for screening for infections with a predominantly sexual mode of transmission: Secondary | ICD-10-CM | POA: Diagnosis not present

## 2019-10-10 DIAGNOSIS — Z6828 Body mass index (BMI) 28.0-28.9, adult: Secondary | ICD-10-CM | POA: Diagnosis not present

## 2019-10-11 DIAGNOSIS — J209 Acute bronchitis, unspecified: Secondary | ICD-10-CM | POA: Diagnosis not present

## 2019-11-24 DIAGNOSIS — F429 Obsessive-compulsive disorder, unspecified: Secondary | ICD-10-CM | POA: Diagnosis not present

## 2019-11-24 DIAGNOSIS — Z0000 Encounter for general adult medical examination without abnormal findings: Secondary | ICD-10-CM | POA: Diagnosis not present

## 2019-11-24 DIAGNOSIS — R739 Hyperglycemia, unspecified: Secondary | ICD-10-CM | POA: Diagnosis not present

## 2019-12-06 DIAGNOSIS — H5213 Myopia, bilateral: Secondary | ICD-10-CM | POA: Diagnosis not present

## 2019-12-21 DIAGNOSIS — M5441 Lumbago with sciatica, right side: Secondary | ICD-10-CM | POA: Diagnosis not present

## 2019-12-21 DIAGNOSIS — Z0000 Encounter for general adult medical examination without abnormal findings: Secondary | ICD-10-CM | POA: Diagnosis not present

## 2019-12-21 DIAGNOSIS — Z13220 Encounter for screening for lipoid disorders: Secondary | ICD-10-CM | POA: Diagnosis not present

## 2020-01-18 DIAGNOSIS — K625 Hemorrhage of anus and rectum: Secondary | ICD-10-CM | POA: Diagnosis not present

## 2020-01-18 DIAGNOSIS — E669 Obesity, unspecified: Secondary | ICD-10-CM | POA: Diagnosis not present

## 2020-01-18 DIAGNOSIS — Z8379 Family history of other diseases of the digestive system: Secondary | ICD-10-CM | POA: Diagnosis not present

## 2020-01-18 DIAGNOSIS — Z1211 Encounter for screening for malignant neoplasm of colon: Secondary | ICD-10-CM | POA: Diagnosis not present

## 2020-01-24 DIAGNOSIS — Z1211 Encounter for screening for malignant neoplasm of colon: Secondary | ICD-10-CM | POA: Diagnosis not present

## 2020-01-24 DIAGNOSIS — K5900 Constipation, unspecified: Secondary | ICD-10-CM | POA: Diagnosis not present

## 2020-01-24 DIAGNOSIS — Z8379 Family history of other diseases of the digestive system: Secondary | ICD-10-CM | POA: Diagnosis not present

## 2020-01-24 DIAGNOSIS — K6389 Other specified diseases of intestine: Secondary | ICD-10-CM | POA: Diagnosis not present

## 2020-01-24 DIAGNOSIS — K625 Hemorrhage of anus and rectum: Secondary | ICD-10-CM | POA: Diagnosis not present

## 2020-02-06 DIAGNOSIS — F429 Obsessive-compulsive disorder, unspecified: Secondary | ICD-10-CM | POA: Diagnosis not present

## 2020-02-06 DIAGNOSIS — R519 Headache, unspecified: Secondary | ICD-10-CM | POA: Diagnosis not present

## 2020-02-08 DIAGNOSIS — K5900 Constipation, unspecified: Secondary | ICD-10-CM | POA: Diagnosis not present

## 2020-02-21 DIAGNOSIS — Z23 Encounter for immunization: Secondary | ICD-10-CM | POA: Diagnosis not present

## 2020-03-01 DIAGNOSIS — D2239 Melanocytic nevi of other parts of face: Secondary | ICD-10-CM | POA: Diagnosis not present

## 2020-03-01 DIAGNOSIS — I788 Other diseases of capillaries: Secondary | ICD-10-CM | POA: Diagnosis not present

## 2020-03-01 DIAGNOSIS — D225 Melanocytic nevi of trunk: Secondary | ICD-10-CM | POA: Diagnosis not present

## 2020-03-01 DIAGNOSIS — D485 Neoplasm of uncertain behavior of skin: Secondary | ICD-10-CM | POA: Diagnosis not present

## 2020-03-01 DIAGNOSIS — D224 Melanocytic nevi of scalp and neck: Secondary | ICD-10-CM | POA: Diagnosis not present

## 2020-03-01 DIAGNOSIS — L910 Hypertrophic scar: Secondary | ICD-10-CM | POA: Diagnosis not present

## 2020-06-03 DIAGNOSIS — D485 Neoplasm of uncertain behavior of skin: Secondary | ICD-10-CM | POA: Diagnosis not present

## 2020-06-03 DIAGNOSIS — L988 Other specified disorders of the skin and subcutaneous tissue: Secondary | ICD-10-CM | POA: Diagnosis not present

## 2020-06-05 DIAGNOSIS — J069 Acute upper respiratory infection, unspecified: Secondary | ICD-10-CM | POA: Diagnosis not present

## 2020-06-06 DIAGNOSIS — R059 Cough, unspecified: Secondary | ICD-10-CM | POA: Diagnosis not present

## 2020-06-06 DIAGNOSIS — R519 Headache, unspecified: Secondary | ICD-10-CM | POA: Diagnosis not present

## 2020-06-10 DIAGNOSIS — Z1152 Encounter for screening for COVID-19: Secondary | ICD-10-CM | POA: Diagnosis not present

## 2020-06-15 IMAGING — US US ABDOMEN LIMITED
1 series · 14 of 24 positions shown · non-contrast
Comparison: CT 09/01/2017

CLINICAL DATA: RIGHT upper quadrant pain

EXAM:
ULTRASOUND ABDOMEN LIMITED RIGHT UPPER QUADRANT

[Series 1: us abdomen limited · 0.20mm/px · 14 of 24 slices shown]
[im 1/24]
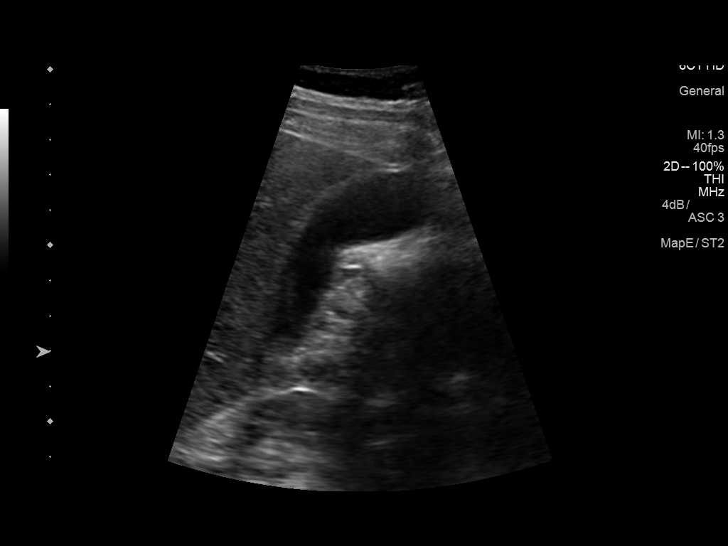
[im 3/24]
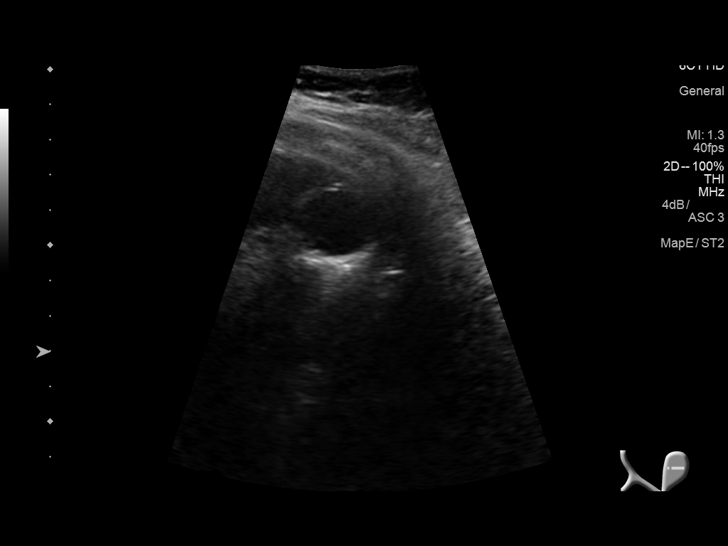
[im 5/24]
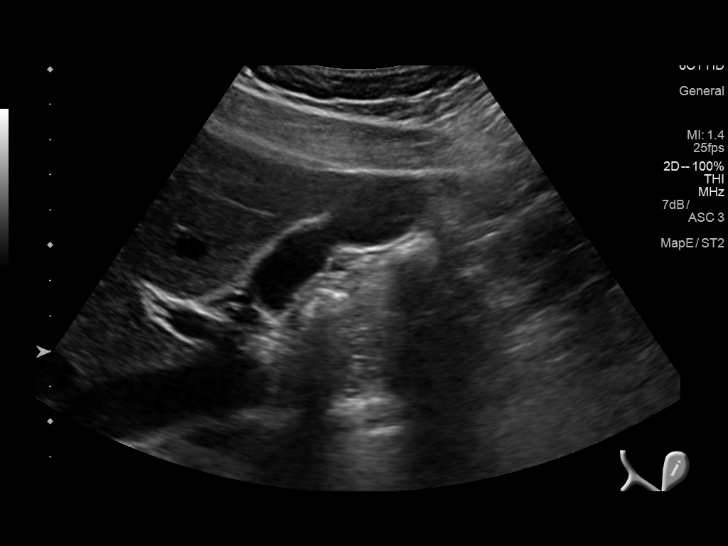
[im 7/24]
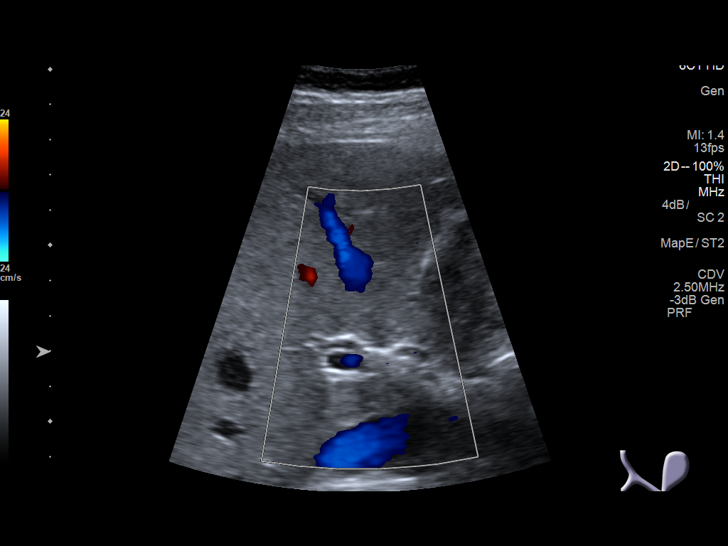
[im 8/24]
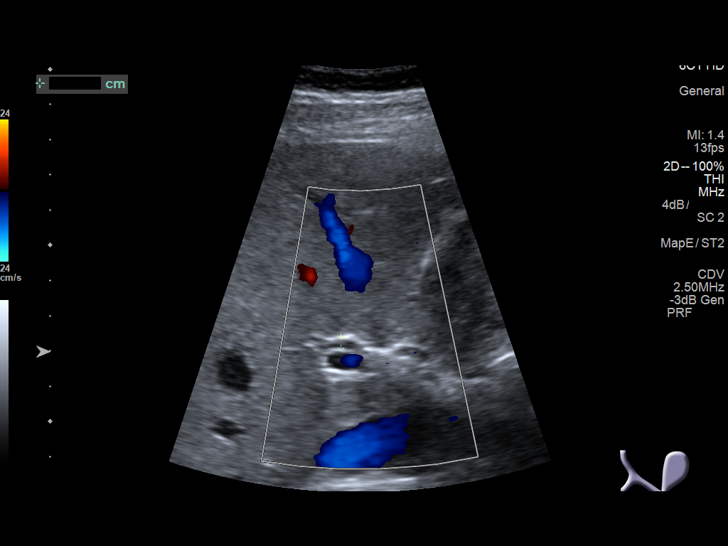
[im 10/24]
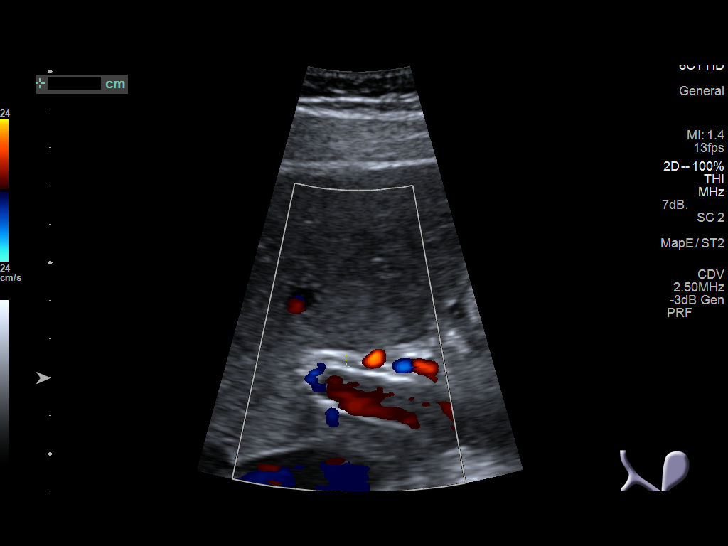
[im 12/24]
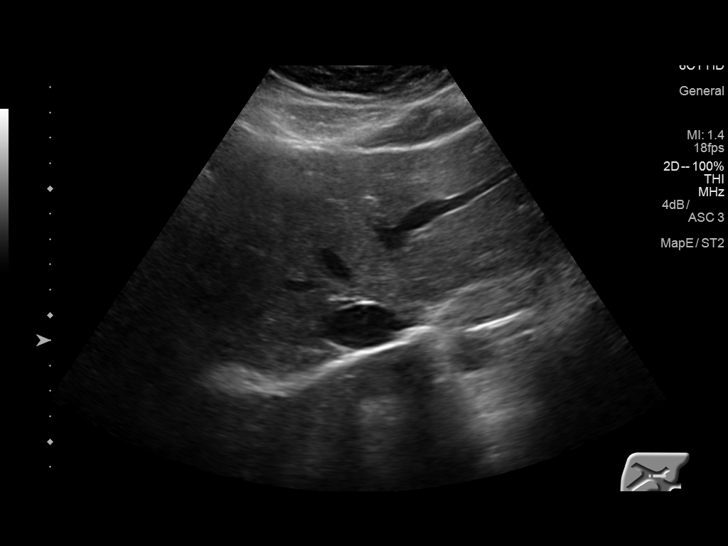
[im 13/24]
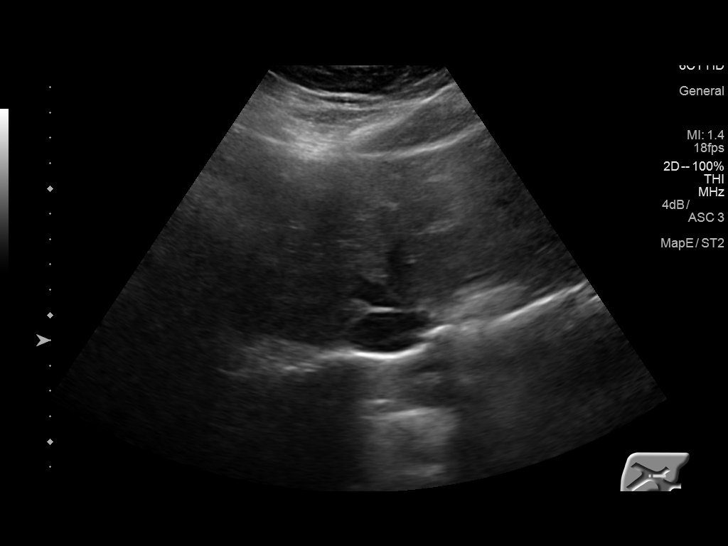
[im 15/24]
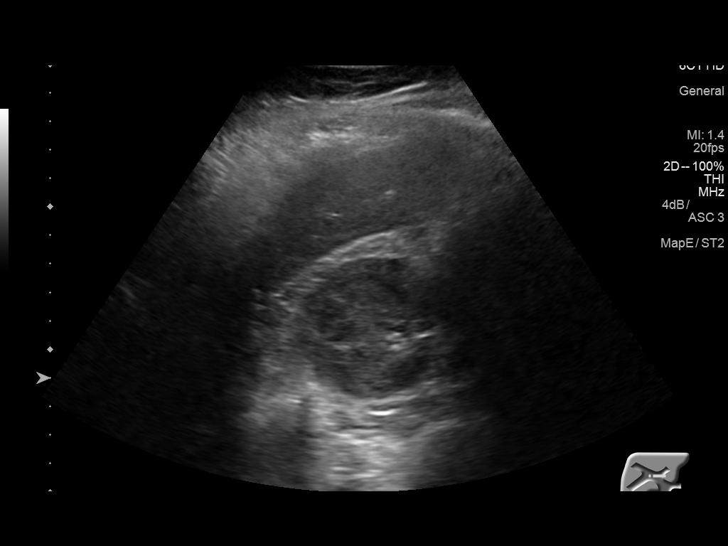
[im 17/24]
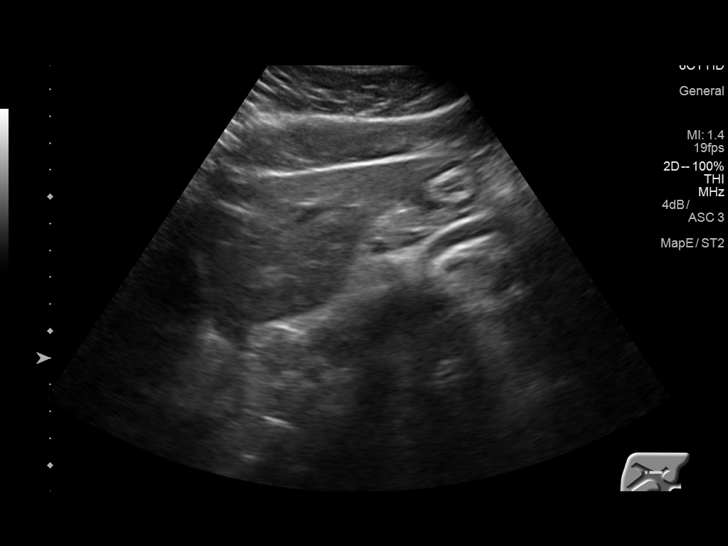
[im 19/24]
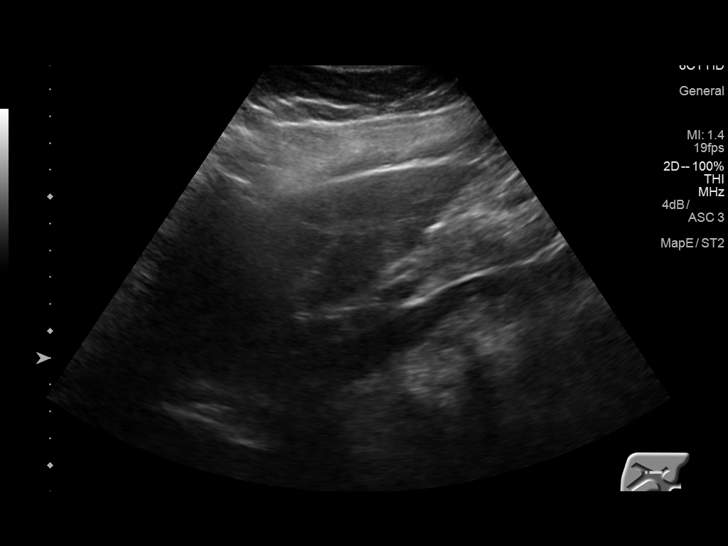
[im 20/24]
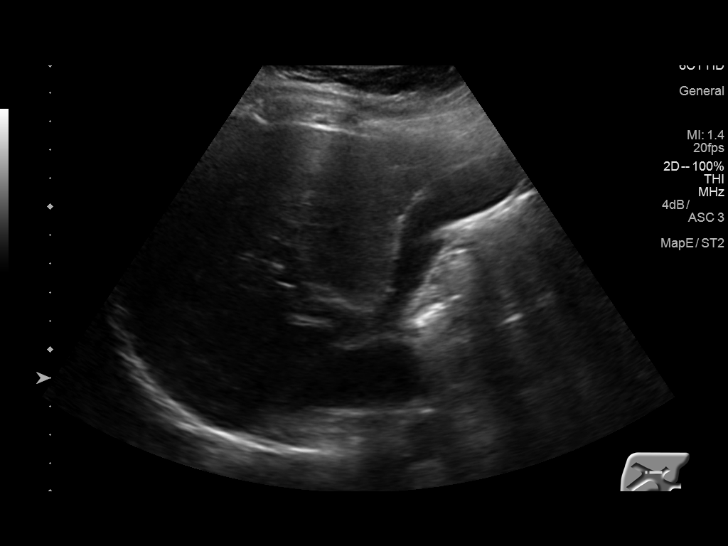
[im 22/24]
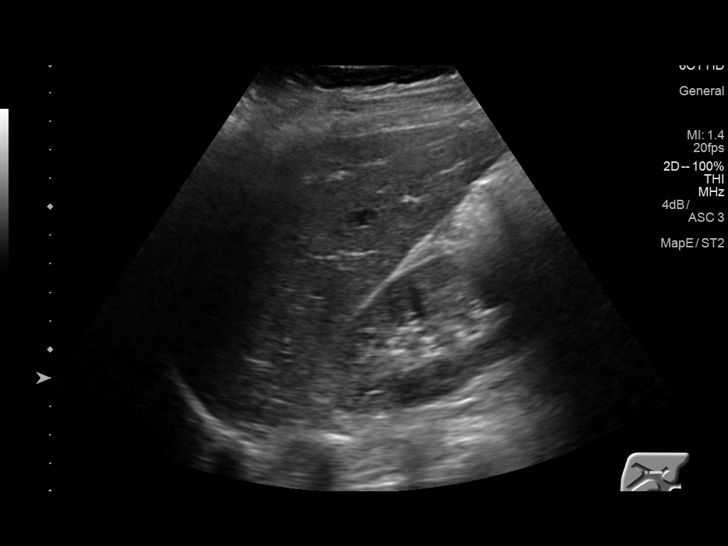
[im 24/24]
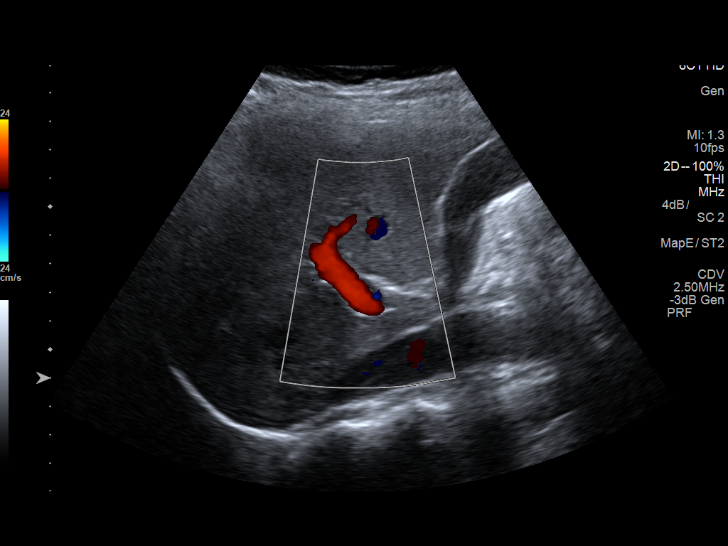

[14 of 24 positions shown; findings below may reference images not displayed]

FINDINGS: Gallbladder:

No gallstones or wall thickening visualized. No sonographic Murphy
sign noted by sonographer.

Common bile duct:

Diameter: Normal 3 mm

Liver:

No focal lesion identified. Within normal limits in parenchymal
echogenicity. Portal vein is patent on color Doppler imaging with
normal direction of blood flow towards the liver.
IMPRESSION: Normal RIGHT upper quadrant ultrasound.

## 2020-07-25 DIAGNOSIS — R5383 Other fatigue: Secondary | ICD-10-CM | POA: Diagnosis not present

## 2020-07-25 DIAGNOSIS — G44209 Tension-type headache, unspecified, not intractable: Secondary | ICD-10-CM | POA: Diagnosis not present

## 2020-07-25 DIAGNOSIS — L659 Nonscarring hair loss, unspecified: Secondary | ICD-10-CM | POA: Diagnosis not present

## 2020-08-07 IMAGING — NM NM HEPATO W/GB/PHARM/[PERSON_NAME]
2 series · 12 of 12 positions shown · non-contrast
Comparison: None.

CLINICAL DATA: Right upper quadrant pain and nausea

EXAM:
NUCLEAR MEDICINE HEPATOBILIARY IMAGING WITH GALLBLADDER EF
VIEWS:
Anterior right upper quadrant
RADIOPHARMACEUTICALS:  5.2 mCi Bc-GGm  Choletec IV

[Series 1: biliary · 4.14mm/px · 6 of 60 frames shown]
[frame 6/60]
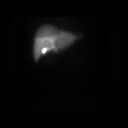
[frame 16/60]
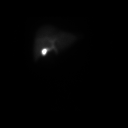
[frame 26/60]
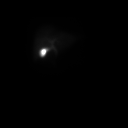
[frame 36/60]
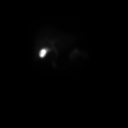
[frame 46/60]
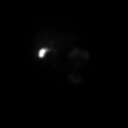
[frame 56/60]
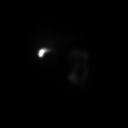

[Series 2: gbef · 4.14mm/px · 6 of 60 frames shown]
[frame 6/60]
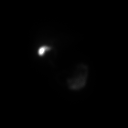
[frame 16/60]
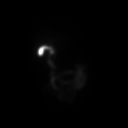
[frame 26/60]
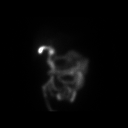
[frame 36/60]
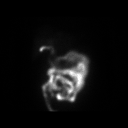
[frame 46/60]
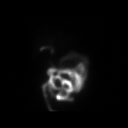
[frame 56/60]
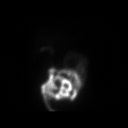

[12 of 12 positions shown; findings below may reference images not displayed]

FINDINGS: Liver uptake of radiotracer is unremarkable. There is prompt
visualization of gallbladder and small bowel, indicating patency of
the cystic and common bile ducts. The patient consumed 8 ounces of
Ensure Complete orally with calculation of the computer generated
ejection fraction of radiotracer from the gallbladder. Patient did
not experience clinical symptoms with the oral Ensure consumption.
The computer generated ejection fraction of radiotracer from the
gallbladder is normal at 99%, normal greater than 33% using the oral
agent.
IMPRESSION: Study within normal limits.

## 2020-10-10 DIAGNOSIS — N760 Acute vaginitis: Secondary | ICD-10-CM | POA: Diagnosis not present

## 2020-10-10 DIAGNOSIS — Z6830 Body mass index (BMI) 30.0-30.9, adult: Secondary | ICD-10-CM | POA: Diagnosis not present

## 2020-10-10 DIAGNOSIS — Z803 Family history of malignant neoplasm of breast: Secondary | ICD-10-CM | POA: Diagnosis not present

## 2020-10-10 DIAGNOSIS — Z01419 Encounter for gynecological examination (general) (routine) without abnormal findings: Secondary | ICD-10-CM | POA: Diagnosis not present

## 2020-10-10 DIAGNOSIS — Z801 Family history of malignant neoplasm of trachea, bronchus and lung: Secondary | ICD-10-CM | POA: Diagnosis not present

## 2020-10-10 DIAGNOSIS — Z8042 Family history of malignant neoplasm of prostate: Secondary | ICD-10-CM | POA: Diagnosis not present

## 2020-10-10 DIAGNOSIS — Z113 Encounter for screening for infections with a predominantly sexual mode of transmission: Secondary | ICD-10-CM | POA: Diagnosis not present

## 2020-10-29 DIAGNOSIS — Z0000 Encounter for general adult medical examination without abnormal findings: Secondary | ICD-10-CM | POA: Diagnosis not present

## 2020-10-29 DIAGNOSIS — Z13220 Encounter for screening for lipoid disorders: Secondary | ICD-10-CM | POA: Diagnosis not present

## 2020-12-04 DIAGNOSIS — M79672 Pain in left foot: Secondary | ICD-10-CM | POA: Diagnosis not present

## 2020-12-18 DIAGNOSIS — M79672 Pain in left foot: Secondary | ICD-10-CM | POA: Diagnosis not present

## 2021-01-15 DIAGNOSIS — M79672 Pain in left foot: Secondary | ICD-10-CM | POA: Diagnosis not present

## 2021-01-15 DIAGNOSIS — E559 Vitamin D deficiency, unspecified: Secondary | ICD-10-CM | POA: Diagnosis not present

## 2021-01-29 DIAGNOSIS — H52203 Unspecified astigmatism, bilateral: Secondary | ICD-10-CM | POA: Diagnosis not present

## 2021-01-29 DIAGNOSIS — H5213 Myopia, bilateral: Secondary | ICD-10-CM | POA: Diagnosis not present

## 2021-02-18 DIAGNOSIS — F422 Mixed obsessional thoughts and acts: Secondary | ICD-10-CM | POA: Diagnosis not present

## 2021-03-05 DIAGNOSIS — D2262 Melanocytic nevi of left upper limb, including shoulder: Secondary | ICD-10-CM | POA: Diagnosis not present

## 2021-03-05 DIAGNOSIS — D225 Melanocytic nevi of trunk: Secondary | ICD-10-CM | POA: Diagnosis not present

## 2021-03-05 DIAGNOSIS — L858 Other specified epidermal thickening: Secondary | ICD-10-CM | POA: Diagnosis not present

## 2021-03-05 DIAGNOSIS — D2261 Melanocytic nevi of right upper limb, including shoulder: Secondary | ICD-10-CM | POA: Diagnosis not present

## 2021-03-26 DIAGNOSIS — F422 Mixed obsessional thoughts and acts: Secondary | ICD-10-CM | POA: Diagnosis not present

## 2021-04-26 DIAGNOSIS — J069 Acute upper respiratory infection, unspecified: Secondary | ICD-10-CM | POA: Diagnosis not present

## 2021-05-17 ENCOUNTER — Encounter (HOSPITAL_BASED_OUTPATIENT_CLINIC_OR_DEPARTMENT_OTHER): Payer: Self-pay | Admitting: Radiology

## 2021-05-17 ENCOUNTER — Other Ambulatory Visit: Payer: Self-pay

## 2021-05-17 ENCOUNTER — Emergency Department (HOSPITAL_BASED_OUTPATIENT_CLINIC_OR_DEPARTMENT_OTHER)
Admission: EM | Admit: 2021-05-17 | Discharge: 2021-05-17 | Disposition: A | Discharge status: Home / Self Care | Payer: BC Managed Care – PPO | Attending: Emergency Medicine | Admitting: Emergency Medicine

## 2021-05-17 DIAGNOSIS — J029 Acute pharyngitis, unspecified: Secondary | ICD-10-CM | POA: Diagnosis not present

## 2021-05-17 DIAGNOSIS — R000 Tachycardia, unspecified: Secondary | ICD-10-CM | POA: Insufficient documentation

## 2021-05-17 DIAGNOSIS — Z20822 Contact with and (suspected) exposure to covid-19: Secondary | ICD-10-CM | POA: Insufficient documentation

## 2021-05-17 LAB — RESP PANEL BY RT-PCR (FLU A&B, COVID) ARPGX2
Influenza A by PCR: NEGATIVE
Influenza B by PCR: NEGATIVE
SARS Coronavirus 2 by RT PCR: NEGATIVE

## 2021-05-17 LAB — GROUP A STREP BY PCR: Group A Strep by PCR: NOT DETECTED

## 2021-05-17 MED ORDER — HYDROCOD POLST-CPM POLST ER 10-8 MG/5ML PO SUER: 5.0000 mL | Freq: Two times a day (BID) | ORAL | 0 refills | Status: AC

## 2021-05-17 MED ORDER — DEXAMETHASONE SODIUM PHOSPHATE 10 MG/ML IJ SOLN
10.0000 mg | Freq: Once | INTRAMUSCULAR | Status: AC
  Administered 2021-05-17: 10 mg via INTRAMUSCULAR

## 2021-05-17 MED ORDER — DEXAMETHASONE SODIUM PHOSPHATE 10 MG/ML IJ SOLN
10.0000 mg | Freq: Once | INTRAMUSCULAR | Status: DC
  Filled 2021-05-17: qty 1

## 2021-05-17 NOTE — ED Notes (Signed)
No acute distress noted upon this RN's departure of patient. Verified discharge paperwork with name and DOB. Vital signs stable. Patient taken to checkout window. Discharge paperwork discussed with mother. No further questions voiced upon discharge.

## 2021-05-17 NOTE — ED Triage Notes (Signed)
Was seen last week for cough and prescribed a cough medication which got ride of the cough but then developed sore throat which has been ongoing for the past 3 days. Pt indorses difficulty swallowing as well as turning her neck.

## 2021-05-17 NOTE — ED Provider Notes (Signed)
[facility] EMERGENCY DEPT Provider Note   CSN: [id number] Arrival date & time: 05/17/21  1911     History Chief Complaint  Patient presents with   Sore Throat    Marie Schwartz is a 30 y.o. female.  Patient is a 30 year old female with a prior history of PE and recurrent strep throat status post tonsillectomy who is presenting today with complaints of sore throat.  She reports last week she had nasal congestion, severe cough and a lot of mucus production.  She was seen at the [facility] walk-in clinic and was given a nasal spray and cough medicine.  She reports the cough is slowly improving and the nasal drainage has been improving but for the last 2 to 3 days she has had worsening sore throat and feeling like her glands are swollen.  She reports it feels like she is swallowing rocks and has not had a desire to eat because of the pain.  She is not aware of having a fever denies any shortness of breath.  The history is provided by the patient.  Sore Throat      Past Medical History:  Diagnosis Date   OCD (obsessive compulsive disorder) 09/01/2017    Patient Active Problem List   Diagnosis Date Noted   PE (pulmonary thromboembolism) (HCC) 09/08/2017   Acute appendicitis 09/01/2017   OCD (obsessive compulsive disorder) 09/01/2017    Past Surgical History:  Procedure Laterality Date   APPENDECTOMY     Incision and drainage of peritonsilar abscess  2004   Jori Moll, MD   LAPAROSCOPIC APPENDECTOMY N/A 09/02/2017   Procedure: APPENDECTOMY LAPAROSCOPIC;  Surgeon: Abigail Miyamoto, MD;  Location: [facility]S;  Service: General;  Laterality: N/A;   TONSILLECTOMY     WISDOM TOOTH EXTRACTION       OB History   No obstetric history on file.     Family History  Problem Relation Age of Onset   Pulmonary embolism Neg Hx    Deep vein thrombosis Neg Hx     Social History   Tobacco Use   Smoking status: Never   Smokeless tobacco: Never  Vaping Use   Vaping Use:  Never used  Substance Use Topics   Alcohol use: Never   Drug use: Never    Home Medications Prior to Admission medications   Medication Sig Start Date End Date Taking? Authorizing Provider  acetaminophen (TYLENOL) 500 MG tablet Take 500 mg by mouth every 6 (six) hours as needed for mild pain.    [provider]  albuterol (VENTOLIN HFA) 108 (90 Base) MCG/ACT inhaler Inhale 1-2 puffs into the lungs every 6 (six) hours as needed for wheezing or shortness of breath. 04/01/19   Bethel Born, PA-C  FLUoxetine (PROZAC) 20 MG tablet Take 60 mg by mouth daily.    [provider]  fluticasone (FLONASE) 50 MCG/ACT nasal spray Place 1 spray into both nostrils daily as needed for allergies or rhinitis.    [provider]  ketotifen (ZADITOR) 0.025 % ophthalmic solution Place 1 drop into both eyes 2 (two) times daily as needed (allergies).    [provider]  Levonorgestrel (KYLEENA) 19.5 MG IUD 19.5 each by Intrauterine route.    [provider]  ondansetron (ZOFRAN ODT) 4 MG disintegrating tablet Take 1 tablet (4 mg total) by mouth every 8 (eight) hours as needed for nausea or vomiting. 09/16/19   Elson Areas, PA-C  ranitidine (ZANTAC) 150 MG tablet Take 1 tablet (150 mg  total) by mouth 2 (two) times daily. Patient not taking: Reported on 03/31/2019 05/20/18   [address], [place], [place]    Allergies    Patient has no known allergies.  Review of Systems   Review of Systems  All other systems reviewed and are negative.  Physical Exam Updated Vital Signs BP (!) 153/96    Pulse (!) 111    Temp 98.2 F (36.8 C)    Resp 16    Ht 5\' 4"  (1.626 m)    Wt 79.4 kg    SpO2 93%    BMI 30.04 kg/m   Physical Exam Vitals and nursing note reviewed.  Constitutional:      General: She is not in acute distress.    Appearance: She is well-developed.  HENT:     Head: Normocephalic and atraumatic.     Right Ear: Tympanic membrane normal.     Left Ear: Tympanic  membrane normal.     Nose: Congestion present.     Mouth/Throat:     Mouth: Mucous membranes are moist.     Comments: Tonsils are absent.  Mild erythema noted to the posterior pharynx Eyes:     Pupils: Pupils are equal, round, and reactive to light.  Neck:     Comments: No trismus or stridor Cardiovascular:     Rate and Rhythm: Regular rhythm. Tachycardia present.     Heart sounds: Normal heart sounds. No murmur heard.   No friction rub.  Pulmonary:     Effort: Pulmonary effort is normal.     Breath sounds: Normal breath sounds. No wheezing or rales.  Abdominal:     General: Bowel sounds are normal. There is no distension.     Palpations: Abdomen is soft.     Tenderness: There is no abdominal tenderness. There is no guarding or rebound.  Musculoskeletal:        General: No tenderness. Normal range of motion.     Cervical back: Normal range of motion and neck supple.     Comments: No edema  Lymphadenopathy:     Cervical: Cervical adenopathy present.  Skin:    General: Skin is warm and dry.     Findings: No rash.  Neurological:     Mental Status: She is alert and oriented to person, place, and time. Mental status is at baseline.     Cranial Nerves: No cranial nerve deficit.  Psychiatric:        Mood and Affect: Mood normal.        Behavior: Behavior normal.    ED Results / Procedures / Treatments   Labs (all labs ordered are listed, but only abnormal results are displayed) Labs Reviewed  RESP PANEL BY RT-PCR (FLU A&B, COVID) [id number]  GROUP A STREP BY PCR    EKG None  Radiology No results found.  Procedures Procedures   Medications Ordered in ED Medications - No data to display  ED Course  I have reviewed the triage vital signs and the nursing notes.  Pertinent labs & imaging results that were available during my care of the patient were reviewed by me and considered in my medical decision making (see chart for details).    MDM Rules/Calculators/A&P                           Pt with symptoms consistent with viral URI.  Well appearing here.  No signs of breathing difficulty  No signs of otitis or abnormal  abdominal findings.  Low suspicion at this time for RPA, epiglottitis or PTA. Covid/flu and strep negand pt to return with any further problems.     Final Clinical Impression(s) / ED Diagnoses Final diagnoses:  Acute pharyngitis, unspecified etiology    Rx / DC Orders ED Discharge Orders     None        Gwyneth Sprout, MD 05/17/21 2340

## 2021-05-19 DIAGNOSIS — J320 Chronic maxillary sinusitis: Secondary | ICD-10-CM | POA: Diagnosis not present

## 2021-05-19 DIAGNOSIS — H9201 Otalgia, right ear: Secondary | ICD-10-CM | POA: Diagnosis not present

## 2021-05-26 ENCOUNTER — Encounter (HOSPITAL_BASED_OUTPATIENT_CLINIC_OR_DEPARTMENT_OTHER): Payer: Self-pay | Admitting: Obstetrics and Gynecology

## 2021-05-26 ENCOUNTER — Other Ambulatory Visit: Payer: Self-pay

## 2021-05-26 ENCOUNTER — Emergency Department (HOSPITAL_BASED_OUTPATIENT_CLINIC_OR_DEPARTMENT_OTHER)
Admission: EM | Admit: 2021-05-26 | Discharge: 2021-05-26 | Disposition: A | Discharge status: Home / Self Care | Payer: BC Managed Care – PPO | Attending: Emergency Medicine | Admitting: Emergency Medicine

## 2021-05-26 DIAGNOSIS — Z79899 Other long term (current) drug therapy: Secondary | ICD-10-CM | POA: Diagnosis not present

## 2021-05-26 DIAGNOSIS — G510 Bell's palsy: Secondary | ICD-10-CM | POA: Diagnosis not present

## 2021-05-26 DIAGNOSIS — R29810 Facial weakness: Secondary | ICD-10-CM | POA: Diagnosis not present

## 2021-05-26 DIAGNOSIS — R030 Elevated blood-pressure reading, without diagnosis of hypertension: Secondary | ICD-10-CM

## 2021-05-26 MED ORDER — PREDNISONE 20 MG PO TABS: ORAL_TABLET | ORAL | 0 refills | Status: AC

## 2021-05-26 MED ORDER — VALACYCLOVIR HCL 1 G PO TABS: 1000.0000 mg | ORAL_TABLET | Freq: Three times a day (TID) | ORAL | 0 refills | Status: AC

## 2021-05-26 NOTE — Discharge Instructions (Addendum)
It was our pleasure to provide your ER care today - we hope that you feel better.  Take valtrex and prednisone as prescribed.   Use moistening eye drops as directed to avoid eye pain/irritation and to ensure eye stays moist. If lid will not close, tape gently closed at nighttime when sleeping.   Follow up with primary care doctor in one week - also have blood pressure rechecked then as it is mildly high today.   Return to ER if worse, new symptoms, change in speech or vision, one-side of body numbness/weakness, severe eye pain, severe headache, high fevers, or other concern.

## 2021-05-26 NOTE — ED Triage Notes (Signed)
Patient reports to the ER for facial numbness on the right side. Patient unable to smile and unable to fully close right eye. Patient reports right sided ear pain and popping. Patient reports feelings of fullness in right ear that she just finished an antibiotic for. Denies hx of bell palsy. Patient able to move eyebrows bilaterally. Patient denies speech changes but reports some mild bluriness on the right eye.

## 2021-05-26 NOTE — ED Provider Notes (Signed)
[facility] EMERGENCY DEPT Provider Note   CSN: [id number] Arrival date & time: 05/26/21  1640     History Chief Complaint  Patient presents with   Facial Droop    Marie Schwartz is a 30 y.o. female.  Patient c/o right sided facial weakness, symptoms onset onset in past day, moderate, constant, persistent. No hx same. States last week was treated for right ear infection. No current drainage from ear, no hearing loss, no tinnitus, no headaches. No fever or chills. Denies rash to face/ear. No change in speech or vision. No extremity numbness or weakness or loss of normal functional ability. No problems w balance or gait.   The history is provided by the patient and medical records.      Past Medical History:  Diagnosis Date   OCD (obsessive compulsive disorder) 09/01/2017    Patient Active Problem List   Diagnosis Date Noted   PE (pulmonary thromboembolism) (HCC) 09/08/2017   Acute appendicitis 09/01/2017   OCD (obsessive compulsive disorder) 09/01/2017    Past Surgical History:  Procedure Laterality Date   APPENDECTOMY     Incision and drainage of peritonsilar abscess  2004   Jori Moll, MD   LAPAROSCOPIC APPENDECTOMY N/A 09/02/2017   Procedure: APPENDECTOMY LAPAROSCOPIC;  Surgeon: Abigail Miyamoto, MD;  Location: [facility];  Service: General;  Laterality: N/A;   TONSILLECTOMY     WISDOM TOOTH EXTRACTION       OB History   No obstetric history on file.     Family History  Problem Relation Age of Onset   Pulmonary embolism Neg Hx    Deep vein thrombosis Neg Hx     Social History   Tobacco Use   Smoking status: Never   Smokeless tobacco: Never  Vaping Use   Vaping Use: Never used  Substance Use Topics   Alcohol use: Never   Drug use: Never    Home Medications Prior to Admission medications   Medication Sig Start Date End Date Taking? Authorizing Provider  acetaminophen (TYLENOL) 500 MG tablet Take 500 mg by mouth every 6 (six) hours as  needed for mild pain.    [provider]  albuterol (VENTOLIN HFA) 108 (90 Base) MCG/ACT inhaler Inhale 1-2 puffs into the lungs every 6 (six) hours as needed for wheezing or shortness of breath. 04/01/19   Bethel Born, PA-C  chlorpheniramine-HYDROcodone (TUSSIONEX PENNKINETIC ER) 10-8 MG/5ML SUER Take 5 mLs by mouth 2 (two) times daily. 05/17/21   Gwyneth Sprout, MD  FLUoxetine (PROZAC) 20 MG tablet Take 60 mg by mouth daily.    [provider]  fluticasone (FLONASE) 50 MCG/ACT nasal spray Place 1 spray into both nostrils daily as needed for allergies or rhinitis.    [provider]  ketotifen (ZADITOR) 0.025 % ophthalmic solution Place 1 drop into both eyes 2 (two) times daily as needed (allergies).    [provider]  Levonorgestrel (KYLEENA) 19.5 MG IUD 19.5 each by Intrauterine route.    [provider]  ondansetron (ZOFRAN ODT) 4 MG disintegrating tablet Take 1 tablet (4 mg total) by mouth every 8 (eight) hours as needed for nausea or vomiting. 09/16/19   Elson Areas, PA-C  ranitidine (ZANTAC) 150 MG tablet Take 1 tablet (150 mg total) by mouth 2 (two) times daily. Patient not taking: Reported on 03/31/2019 05/20/18   [address], [place], [place]    Allergies    Patient has no known allergies.  Review of Systems   Review of Systems  Constitutional:  Negative for chills and fever.  HENT:  Negative for sore throat.   Eyes:  Negative for photophobia, pain, redness and visual disturbance.  Respiratory:  Negative for cough and shortness of breath.   Cardiovascular:  Negative for chest pain.  Gastrointestinal:  Negative for abdominal pain, nausea and vomiting.  Genitourinary:  Negative for flank pain.  Musculoskeletal:  Negative for back pain and neck pain.  Skin:  Negative for rash.  Neurological:  Negative for headaches.  Hematological:  Does not bruise/bleed easily.  Psychiatric/Behavioral:  Negative for confusion.    Physical  Exam Updated Vital Signs BP (!) 145/95    Pulse 96    Temp 97.8 F (36.6 C)    Resp 19    Ht 1.626 m (5\' 4" )    Wt 79.3 kg    SpO2 97%    BMI 30.03 kg/m   Physical Exam Vitals and nursing note reviewed.  Constitutional:      Appearance: Normal appearance. She is well-developed.  HENT:     Head: Atraumatic.     Ears:     Comments: Right ear partially occluded with cerumen. No vesicular lesions or rash noted. No acute om. No mastoid  tenderness.     Nose: Nose normal.     Mouth/Throat:     Mouth: Mucous membranes are moist.  Eyes:     General: No scleral icterus.    Extraocular Movements: Extraocular movements intact.     Conjunctiva/sclera: Conjunctivae normal.     Pupils: Pupils are equal, round, and reactive to light.  Neck:     Vascular: No carotid bruit.     Trachea: No tracheal deviation.  Cardiovascular:     Rate and Rhythm: Normal rate and regular rhythm.     Pulses: Normal pulses.     Heart sounds: Normal heart sounds. No murmur heard.   No friction rub. No gallop.  Pulmonary:     Effort: Pulmonary effort is normal. No respiratory distress.     Breath sounds: Normal breath sounds.  Abdominal:     General: There is no distension.     Palpations: Abdomen is soft.     Tenderness: There is no abdominal tenderness.  Genitourinary:    Comments: No cva tenderness.  Musculoskeletal:        General: No swelling.     Cervical back: Normal range of motion and neck supple. No rigidity. No muscular tenderness.  Skin:    General: Skin is warm and dry.     Findings: No rash.  Neurological:     Mental Status: She is alert.     Comments: Alert, speech normal. Right sided facial weakness, mild, diffuse, including involvement of forehead. Is able to close right eye but weaker than left.  No pronator drift. Motor fxn intact bil ext, stre 5/5. Sens grossly intact. Steady gait.   Psychiatric:        Mood and Affect: Mood normal.    ED Results / Procedures / Treatments    Labs (all labs ordered are listed, but only abnormal results are displayed) Labs Reviewed - No data to display  EKG None  Radiology No results found.  Procedures Procedures   Medications Ordered in ED Medications - No data to display  ED Course  I have reviewed the triage vital signs and the nursing notes.  Pertinent labs & imaging results that were available during my care of the patient were reviewed by me and considered in my medical decision  making (see chart for details).    MDM Rules/Calculators/A&P                         Reviewed nursing notes and prior charts for additional history.   Patients exam c/w Bells Palsy.  Confirmed nkda.   RX prednisone and valtrex given.   Rec pcp f/u.     Final Clinical Impression(s) / ED Diagnoses Final diagnoses:  None    Rx / DC Orders ED Discharge Orders     None        Cathren Laine, MD 05/26/21 1807

## 2021-06-04 DIAGNOSIS — H10413 Chronic giant papillary conjunctivitis, bilateral: Secondary | ICD-10-CM | POA: Diagnosis not present

## 2021-06-06 DIAGNOSIS — R0981 Nasal congestion: Secondary | ICD-10-CM | POA: Diagnosis not present

## 2021-06-06 DIAGNOSIS — J029 Acute pharyngitis, unspecified: Secondary | ICD-10-CM | POA: Diagnosis not present

## 2021-06-06 DIAGNOSIS — H9311 Tinnitus, right ear: Secondary | ICD-10-CM | POA: Diagnosis not present

## 2021-06-06 DIAGNOSIS — G510 Bell's palsy: Secondary | ICD-10-CM | POA: Diagnosis not present

## 2021-06-06 DIAGNOSIS — J069 Acute upper respiratory infection, unspecified: Secondary | ICD-10-CM | POA: Diagnosis not present

## 2021-06-06 DIAGNOSIS — J342 Deviated nasal septum: Secondary | ICD-10-CM | POA: Diagnosis not present

## 2021-06-06 DIAGNOSIS — H938X1 Other specified disorders of right ear: Secondary | ICD-10-CM | POA: Diagnosis not present

## 2021-06-06 DIAGNOSIS — H6123 Impacted cerumen, bilateral: Secondary | ICD-10-CM | POA: Diagnosis not present

## 2021-07-03 DIAGNOSIS — R070 Pain in throat: Secondary | ICD-10-CM | POA: Diagnosis not present

## 2021-07-03 DIAGNOSIS — H93231 Hyperacusis, right ear: Secondary | ICD-10-CM | POA: Diagnosis not present

## 2021-07-03 DIAGNOSIS — J342 Deviated nasal septum: Secondary | ICD-10-CM | POA: Diagnosis not present

## 2021-07-03 DIAGNOSIS — J3489 Other specified disorders of nose and nasal sinuses: Secondary | ICD-10-CM | POA: Diagnosis not present

## 2021-08-05 DIAGNOSIS — F4321 Adjustment disorder with depressed mood: Secondary | ICD-10-CM | POA: Diagnosis not present

## 2021-08-05 DIAGNOSIS — F422 Mixed obsessional thoughts and acts: Secondary | ICD-10-CM | POA: Diagnosis not present

## 2021-10-28 DIAGNOSIS — N760 Acute vaginitis: Secondary | ICD-10-CM | POA: Diagnosis not present

## 2021-10-28 DIAGNOSIS — Z6830 Body mass index (BMI) 30.0-30.9, adult: Secondary | ICD-10-CM | POA: Diagnosis not present

## 2021-10-28 DIAGNOSIS — Z01419 Encounter for gynecological examination (general) (routine) without abnormal findings: Secondary | ICD-10-CM | POA: Diagnosis not present

## 2021-10-28 DIAGNOSIS — Z3202 Encounter for pregnancy test, result negative: Secondary | ICD-10-CM | POA: Diagnosis not present

## 2021-10-31 DIAGNOSIS — Z13220 Encounter for screening for lipoid disorders: Secondary | ICD-10-CM | POA: Diagnosis not present

## 2021-10-31 DIAGNOSIS — Z0000 Encounter for general adult medical examination without abnormal findings: Secondary | ICD-10-CM | POA: Diagnosis not present

## 2021-11-13 DIAGNOSIS — M79641 Pain in right hand: Secondary | ICD-10-CM | POA: Diagnosis not present

## 2021-11-23 DIAGNOSIS — M79641 Pain in right hand: Secondary | ICD-10-CM | POA: Diagnosis not present

## 2021-12-01 DIAGNOSIS — M65831 Other synovitis and tenosynovitis, right forearm: Secondary | ICD-10-CM | POA: Diagnosis not present

## 2021-12-23 DIAGNOSIS — F422 Mixed obsessional thoughts and acts: Secondary | ICD-10-CM | POA: Diagnosis not present

## 2022-02-10 DIAGNOSIS — H5213 Myopia, bilateral: Secondary | ICD-10-CM | POA: Diagnosis not present

## 2022-02-24 DIAGNOSIS — F422 Mixed obsessional thoughts and acts: Secondary | ICD-10-CM | POA: Diagnosis not present

## 2022-03-17 DIAGNOSIS — L918 Other hypertrophic disorders of the skin: Secondary | ICD-10-CM | POA: Diagnosis not present

## 2022-03-17 DIAGNOSIS — D2262 Melanocytic nevi of left upper limb, including shoulder: Secondary | ICD-10-CM | POA: Diagnosis not present

## 2022-03-17 DIAGNOSIS — B078 Other viral warts: Secondary | ICD-10-CM | POA: Diagnosis not present

## 2022-03-17 DIAGNOSIS — D2261 Melanocytic nevi of right upper limb, including shoulder: Secondary | ICD-10-CM | POA: Diagnosis not present

## 2022-03-17 DIAGNOSIS — D225 Melanocytic nevi of trunk: Secondary | ICD-10-CM | POA: Diagnosis not present

## 2022-04-17 DIAGNOSIS — Z23 Encounter for immunization: Secondary | ICD-10-CM | POA: Diagnosis not present

## 2022-05-04 DIAGNOSIS — M79644 Pain in right finger(s): Secondary | ICD-10-CM | POA: Diagnosis not present
